# Patient Record
Sex: Female | Born: 2000
Health system: Southern US, Community
[De-identification: ages and names within clinical notes are randomized; demographics above are authoritative.]

## PROBLEM LIST (undated history)

## (undated) DIAGNOSIS — E079 Disorder of thyroid, unspecified: Secondary | ICD-10-CM

## (undated) DIAGNOSIS — E119 Type 2 diabetes mellitus without complications: Secondary | ICD-10-CM

## (undated) HISTORY — DX: Type 2 diabetes mellitus without complications: E11.9

## (undated) HISTORY — PX: APPENDECTOMY: SHX54

## (undated) HISTORY — DX: Disorder of thyroid, unspecified: E07.9

---
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
## 2019-02-14 ENCOUNTER — Other Ambulatory Visit: Payer: Self-pay

## 2019-02-14 ENCOUNTER — Ambulatory Visit (INDEPENDENT_AMBULATORY_CARE_PROVIDER_SITE_OTHER): Payer: BC Managed Care – PPO | Admitting: Family

## 2019-02-14 ENCOUNTER — Encounter (INDEPENDENT_AMBULATORY_CARE_PROVIDER_SITE_OTHER): Payer: Self-pay | Admitting: Family

## 2019-02-14 VITALS — BP 108/70 | HR 96 | Ht 63.15 in | Wt 164.2 lb

## 2019-02-14 DIAGNOSIS — E1065 Type 1 diabetes mellitus with hyperglycemia: Secondary | ICD-10-CM

## 2019-02-14 DIAGNOSIS — R7309 Other abnormal glucose: Secondary | ICD-10-CM

## 2019-02-14 DIAGNOSIS — IMO0001 Reserved for inherently not codable concepts without codable children: Secondary | ICD-10-CM

## 2019-02-14 DIAGNOSIS — E10649 Type 1 diabetes mellitus with hypoglycemia without coma: Secondary | ICD-10-CM | POA: Diagnosis not present

## 2019-02-14 DIAGNOSIS — R739 Hyperglycemia, unspecified: Secondary | ICD-10-CM

## 2019-02-14 LAB — POCT GLYCOSYLATED HEMOGLOBIN (HGB A1C): Hemoglobin A1C: 7.6 % — AB (ref 4.0–5.6)

## 2019-02-14 LAB — POCT GLUCOSE (DEVICE FOR HOME USE): Glucose Fasting, POC: 214 mg/dL — AB (ref 70–99)

## 2019-02-14 NOTE — Patient Instructions (Signed)
-  Always have fast sugar with you in case of low blood sugar (glucose tabs, regular juice or soda, candy) -Always wear your ID that states you have diabetes -Always bring your meter to your visit -Call/Email if you want to review blood sugars   - Upgrade to control IQ   - Look at getting [place] to put on before dexcom.

## 2019-02-14 NOTE — Progress Notes (Signed)
Pediatric Endocrinology Diabetes Consultation Follow-up Visit  [name] 2001/06/15 [id number]  Chief Complaint: Follow-up type 1 diabetes   Elizabeth Frees, MD   HPI: Elizabeth Stephenson  is a 18 y.o. female presenting for follow-up of type 1 diabetes. she is accompanied to this visit by her Mother.  83. [name] was diagnosed with type 1 diabetes when she was 18 years old. She believes it was the summer of 2012. Her older brother was diagnosed with type 1 diabetes at age 85. Mom recognized weight loss and other symptoms in Elizabeth Stephenson and tested her sugar at home- it was above 300 mg/dL. She called Dr. [name] at [facility] and started her on insulin at home. She was diagnosed with hypothyroidism around the same time. She is transferring care from her PCP office to pediatric endocrinology for management of these issues.    2. Since last visit to [facility] on 07/2018, she has been well.  No ER visits or hospitalizations.  She has been busy working about 40 hours per week. She is not going on any vacation this year. She is using Tslim insulin pump but has not started control IQ. She feels like she is getting more use to it and is ready to upgrade to Control IQ. Hypoglycemia is rare for her, she does not consistently feel when she is low. Starting to rotate sites to legs which is working well for her. s  Taking 75 mcg of levothyroxine per day. Denies missed doses. No fatigue, constipation or cold intolerance.     Insulin regimen: Tslim insulin pump  Basal Rates 12AM 0.95  4am 1.025  6am 0.975  8am 0.975  9pm 0.95    Insulin to Carbohydrate Ratio 12AM 10  6am 9              Insulin Sensitivity Factor 12AM 30                Target Blood Glucose 12AM 150  6am 120  9pm 150           Hypoglycemia: Not feeling lows until under 60 now. Does not feel lows at night. No glucagon needed.  Insulin pump and CGM download   - Avg Bg 185  - Using 61 units per day   - 66% bolus and 34%  basal  Med-alert ID: Not currently wearing. Injection sites: Abdomen (some scar tissue) and legs  Annual labs due: 05/2019 Ophthalmology due: 2019--> discussed importance today.     3. ROS: Greater than 10 systems reviewed with pertinent positives listed in HPI, otherwise neg. Constitutional: Sleeping well. Good energy and appetite. Weight stable.  Eyes: No changes in vision. No blurry vision. Wears glasses. Due for eye exam and discussed today.  Ears/Nose/Mouth/Throat: No difficulty swallowing. No neck pain  Cardiovascular: No palpitations.  Respiratory: No increased work of breathing. No SOB  Gastrointestinal: No constipation or diarrhea. No abdominal pain Genitourinary: No nocturia, no polyuria Musculoskeletal: No joint pain Neurologic: Normal sensation, no tremor Endocrine: No polydipsia.  No hyperpigmentation Psychiatric: Normal affect. No depression or anxiety.   Past Medical History:   Past Medical History:  Diagnosis Date  . Diabetes mellitus without complication ([place])   . Thyroid disease     Medications:  Outpatient Encounter Medications as of 02/14/2019  Medication Sig  . ACCU-CHEK FASTCLIX LANCETS MISC 1 each as directed by Does not apply route. Check sugar 6 x daily  . Continuous Blood Gluc Sensor (DEXCOM G6 SENSOR) MISC 1 kit by Does not apply  route daily as needed.  . Continuous Blood Gluc Transmit (DEXCOM G6 TRANSMITTER) MISC 1 kit by Does not apply route daily as needed.  Marland Kitchen glucagon 1 MG injection Follow package directions for low blood sugar.  Marland Kitchen glucose blood (ACCU-CHEK GUIDE) test strip Use as instructed for 6 checks per day plus per protocol for hyper/hypoglycemia  . insulin aspart (NOVOLOG FLEXPEN) 100 UNIT/ML FlexPen Inject up to 50 units daily in case of pump failure  . insulin aspart (NOVOLOG) 100 UNIT/ML injection UP TO 300 UNITS IN INSULIN PUMP EVERY 48-72 HOURS PER DKA AND HYPERGLYCEMIA PROTOCOLS  . levothyroxine (SYNTHROID) 75 MCG tablet TAKE 1 TABLET BY  MOUTH ONCE DAILY BEFORE BREAKFAST  . lidocaine-prilocaine (EMLA) cream Apply 1 application topically as needed.  . Insulin Glargine (LANTUS SOLOSTAR) 100 UNIT/ML Solostar Pen Up to 50 Units at bedtime and per care plan (Patient not taking: Reported on 02/14/2019)  . Insulin Pen Needle (INSUPEN PEN NEEDLES) 32G X 4 MM MISC BD Pen Needles- brand specific. Inject insulin via insulin pen 6 x daily (Patient not taking: Reported on 02/14/2019)   No facility-administered encounter medications on file as of 02/14/2019.     Allergies: No Known Allergies  Surgical History: No past surgical history on file.  Family History:  Family History  Problem Relation Age of Onset  . Hypertension Father   48 year old brother has Type 1 Diabetes.    Social History: Lives with: Mother and siblings. Stays with dad on weekends.  Looking for jobs. Wants to go to Cosmetology school   Physical Exam:  Vitals:   02/14/19 1129  BP: 108/70  Pulse: 96  Weight: 164 lb 3.2 oz (74.5 kg)  Height: 5' 3.15" (1.604 m)   BP 108/70   Pulse 96   Ht 5' 3.15" (1.604 m)   Wt 164 lb 3.2 oz (74.5 kg)   BMI 28.95 kg/m  Body mass index: body mass index is 28.95 kg/m. Blood pressure percentiles are not available for patients who are 18 years or older.  Ht Readings from Last 3 Encounters:  02/14/19 5' 3.15" (1.604 m) (33 %, Z= -0.44)*  08/09/18 5' 3.11" (1.603 m) (33 %, Z= -0.45)*  06/06/18 5' 3.39" (1.61 m) (37 %, Z= -0.33)*   * Growth percentiles are based on CDC (Girls, 2-20 Years) data.   Wt Readings from Last 3 Encounters:  02/14/19 164 lb 3.2 oz (74.5 kg) (90 %, Z= 1.30)*  08/09/18 161 lb 4.8 oz (73.2 kg) (90 %, Z= 1.27)*  06/06/18 160 lb 4 oz (72.7 kg) (90 %, Z= 1.26)*   * Growth percentiles are based on CDC (Girls, 2-20 Years) data.    Physical exam  General: Well developed, well nourished female in no acute distress.  Alert and oriented.  Head: Normocephalic, atraumatic.   Eyes:  Pupils equal and  round. EOMI.   Sclera white.  No eye drainage.   Ears/Nose/Mouth/Throat: Nares patent, no nasal drainage.  Normal dentition, mucous membranes moist.   Neck: supple, no cervical lymphadenopathy, no thyromegaly Cardiovascular: regular rate, normal S1/S2, no murmurs Respiratory: No increased work of breathing.  Lungs clear to auscultation bilaterally.  No wheezes. Abdomen: soft, nontender, nondistended. Normal bowel sounds.  No appreciable masses  Extremities: warm, well perfused, cap refill < 2 sec.   Musculoskeletal: Normal muscle mass.  Normal strength Skin: warm, dry.  No rash or lesions. Neurologic: alert and oriented, normal speech, no tremor    Labs: Last hemoglobin A1c: 7.7% on 07/2018  Results for orders placed or performed in visit on 02/14/19  POCT Glucose (Device for Home Use)  Result Value Ref Range   Glucose Fasting, POC 214 (A) 70 - 99 mg/dL   POC Glucose    POCT glycosylated hemoglobin (Hb A1C)  Result Value Ref Range   Hemoglobin A1C 7.6 (A) 4.0 - 5.6 %   HbA1c POC (<> result, manual entry)     HbA1c, POC (prediabetic range)     HbA1c, POC (controlled diabetic range)       Assessment/Plan: Whisper is a 18 y.o. female with type 1 diabetes in improving control on Tandem insulin pump and Dexcom CGM. She is doing well with diabetes care. Would benefit from upgrading insulin pump to control IQ. Her hemoglobin A1c is 7.6%.   1-3. DM w/o complication type I, uncontrolled (HCC)/Hyperglycemia/hypoglycemia unawareness  - Reviewed insulin pump and CGM download. Discussed trends and patterns.  - Rotate pump sites to prevent scar tissue.  - bolus 15 minutes prior to eating to limit blood sugar spikes.  - Reviewed carb counting and importance of accurate carb counting.  - Discussed signs and symptoms of hypoglycemia. Always have glucose available.  - POCT glucose and hemoglobin A1c  - Reviewed growth chart.    3. Hypothyroidism, acquired, autoimmune -  75 mcg of  levothyroxine per day  - Reviewed signs and symptoms of hypothyroid.   4. Lipophypertrophy - Rotate pump sites every 3 days. Rotate areas to prevent further scar tissue.   5. Insulin pump titration    No changes today.    Follow-up:   3 months.    I have spent >25 minutes with >50% of time in counseling, education and instruction. When a patient is on insulin, intensive monitoring of blood glucose levels is necessary to avoid hyperglycemia and hypoglycemia. Severe hyperglycemia/hypoglycemia can lead to hospital admissions and be life threatening.     Hermenia Bers,  FNP-C  Pediatric Specialist  [address] [place]  [place], [address]  Tele: [phone]

## 2019-04-04 ENCOUNTER — Other Ambulatory Visit (INDEPENDENT_AMBULATORY_CARE_PROVIDER_SITE_OTHER): Payer: Self-pay | Admitting: Family

## 2019-04-04 DIAGNOSIS — IMO0001 Reserved for inherently not codable concepts without codable children: Secondary | ICD-10-CM

## 2019-04-08 ENCOUNTER — Other Ambulatory Visit (INDEPENDENT_AMBULATORY_CARE_PROVIDER_SITE_OTHER): Payer: Self-pay | Admitting: Family

## 2019-04-08 DIAGNOSIS — IMO0001 Reserved for inherently not codable concepts without codable children: Secondary | ICD-10-CM

## 2019-05-11 DIAGNOSIS — Z635 Disruption of family by separation and divorce: Secondary | ICD-10-CM | POA: Diagnosis not present

## 2019-05-11 DIAGNOSIS — F4323 Adjustment disorder with mixed anxiety and depressed mood: Secondary | ICD-10-CM | POA: Diagnosis not present

## 2019-05-17 ENCOUNTER — Other Ambulatory Visit: Payer: Self-pay

## 2019-05-17 ENCOUNTER — Ambulatory Visit (INDEPENDENT_AMBULATORY_CARE_PROVIDER_SITE_OTHER): Payer: BC Managed Care – PPO | Admitting: Family

## 2019-05-17 ENCOUNTER — Encounter (INDEPENDENT_AMBULATORY_CARE_PROVIDER_SITE_OTHER): Payer: Self-pay | Admitting: Family

## 2019-05-17 VITALS — BP 114/62 | HR 76 | Wt 170.2 lb

## 2019-05-17 DIAGNOSIS — E108 Type 1 diabetes mellitus with unspecified complications: Secondary | ICD-10-CM

## 2019-05-17 DIAGNOSIS — E063 Autoimmune thyroiditis: Secondary | ICD-10-CM | POA: Diagnosis not present

## 2019-05-17 DIAGNOSIS — E10649 Type 1 diabetes mellitus with hypoglycemia without coma: Secondary | ICD-10-CM

## 2019-05-17 DIAGNOSIS — Z4681 Encounter for fitting and adjustment of insulin pump: Secondary | ICD-10-CM

## 2019-05-17 DIAGNOSIS — E65 Localized adiposity: Secondary | ICD-10-CM | POA: Diagnosis not present

## 2019-05-17 DIAGNOSIS — E1065 Type 1 diabetes mellitus with hyperglycemia: Secondary | ICD-10-CM

## 2019-05-17 DIAGNOSIS — IMO0002 Reserved for concepts with insufficient information to code with codable children: Secondary | ICD-10-CM

## 2019-05-17 DIAGNOSIS — R7309 Other abnormal glucose: Secondary | ICD-10-CM

## 2019-05-17 DIAGNOSIS — R739 Hyperglycemia, unspecified: Secondary | ICD-10-CM

## 2019-05-17 LAB — POCT URINALYSIS DIPSTICK
Glucose, UA: POSITIVE — AB
Ketones, UA: NEGATIVE

## 2019-05-17 LAB — POCT GLYCOSYLATED HEMOGLOBIN (HGB A1C): Hemoglobin A1C: 7.2 % — AB (ref 4.0–5.6)

## 2019-05-17 LAB — POCT GLUCOSE (DEVICE FOR HOME USE): POC Glucose: 406 mg/dL — AB (ref 70–99)

## 2019-05-17 NOTE — Progress Notes (Signed)
Pediatric Endocrinology Diabetes Consultation Follow-up Visit  Elizabeth Stephenson 07/25/2000 [id number]  Chief Complaint: Follow-up type 1 diabetes   Elizabeth Frees, MD   HPI: Elizabeth Stephenson  is a 18 y.o. female presenting for follow-up of type 1 diabetes. she is accompanied to this visit by her Mother.  30. [name] was diagnosed with type 1 diabetes when she was 18 years old. She believes it was the summer of 2012. Her older brother was diagnosed with type 1 diabetes at age 64. Mom recognized weight loss and other symptoms in Elizabeth Stephenson and tested her sugar at home- it was above 300 mg/dL. She called Dr. [name] at [facility] and started her on insulin at home. She was diagnosed with hypothyroidism around the same time. She is transferring care from her PCP office to pediatric endocrinology for management of these issues.    2. Since last visit to [facility] on 01/2019, she has been well.  No ER visits or hospitalizations.  She has been very busy working at [employer] working about 30 hours per week. She is doing exercise with a friend, mainly yoga, about 3 days per week. She feels like things with her diabetes care are going much better overall. Using Tslim insulin pump with Dexcom CGM, she is not using control IQ yet, it is working well. She is bolusing before she eats most of the time. Rarely having hypoglycemia, sometimes feels her lows.   She is trying ot stay away from her abdomen where her scar tissue is. She tends to have site failure when she uses front of her abdomen where her scar tissue.   Taking 75 mcg of levothyroxine per day. Denies missed doses. No fatigue, constipation or cold intolerance.     Insulin regimen: Tslim insulin pump  Basal Rates 12AM 0.95  4am 1.025  6am 0.975  8am 0.975  9pm 0.95    Insulin to Carbohydrate Ratio 12AM 10  6am 9              Insulin Sensitivity Factor 12AM 30                Target Blood Glucose 12AM 150  6am 120  9pm 150            Hypoglycemia: Not feeling lows until under 60 now. Does not feel lows at night. No glucagon needed.  Insulin pump and CGM download   - Avg Bg 172  - Target range; in target 56%, above target 41% and below target 2%   - Using 63 units per day   - 35% basal and 65% bolus  Med-alert ID: Not currently wearing. Injection sites: Abdomen (some scar tissue) and legs  Annual labs due: 05/2019 Ophthalmology due: 2019--> discussed importance today.     3. ROS: Greater than 10 systems reviewed with pertinent positives listed in HPI, otherwise neg. Constitutional: Sleeping well. Weight stable.   Eyes: No changes in vision. No blurry vision. Wears glasses. Due for eye exam and discussed today.  Ears/Nose/Mouth/Throat: No difficulty swallowing. No neck pain  Cardiovascular: No palpitations.  Respiratory: No increased work of breathing. No SOB  Gastrointestinal: No constipation or diarrhea. No abdominal pain Genitourinary: No nocturia, no polyuria Musculoskeletal: No joint pain Neurologic: Normal sensation, no tremor Endocrine: No polydipsia.  No hyperpigmentation Psychiatric: Normal affect. No depression or anxiety.   Past Medical History:   Past Medical History:  Diagnosis Date  . Diabetes mellitus without complication ([place])   . Thyroid disease  Medications:  Outpatient Encounter Medications as of 05/17/2019  Medication Sig  . ACCU-CHEK FASTCLIX LANCETS MISC 1 each as directed by Does not apply route. Check sugar 6 x daily  . Continuous Blood Gluc Sensor (DEXCOM G6 SENSOR) MISC USE EVERY DAY AS AS NEEDED  . Continuous Blood Gluc Transmit (DEXCOM G6 TRANSMITTER) MISC 1 kit by Does not apply route daily as needed.  . glucagon 1 MG injection Follow package directions for low blood sugar.  . glucose blood (ACCU-CHEK GUIDE) test strip Use as instructed for 6 checks per day plus per protocol for hyper/hypoglycemia  . insulin aspart (NOVOLOG FLEXPEN) 100 UNIT/ML FlexPen Inject up  to 50 units daily in case of pump failure  . insulin aspart (NOVOLOG) 100 UNIT/ML injection INJECT UP TO 300 UNITS IN INSULIN PUMP EVERY 48 HOURS  . Insulin Glargine (LANTUS SOLOSTAR) 100 UNIT/ML Solostar Pen Up to 50 Units at bedtime and per care plan  . Insulin Pen Needle (INSUPEN PEN NEEDLES) 32G X 4 MM MISC BD Pen Needles- brand specific. Inject insulin via insulin pen 6 x daily  . levothyroxine (SYNTHROID) 75 MCG tablet TAKE 1 TABLET BY MOUTH ONCE DAILY BEFORE BREAKFAST  . lidocaine-prilocaine (EMLA) cream Apply 1 application topically as needed.   No facility-administered encounter medications on file as of 05/17/2019.     Allergies: No Known Allergies  Surgical History: No past surgical history on file.  Family History:  Family History  Problem Relation Age of Onset  . Hypertension Father   19 year old brother has Type 1 Diabetes.    Social History: Lives with: Mother and siblings. Stays with dad on weekends.  Looking for jobs. Wants to go to Cosmetology school   Physical Exam:  Vitals:   05/17/19 1116  BP: 114/62  Pulse: 76  Weight: 170 lb 3.2 oz (77.2 kg)   BP 114/62   Pulse 76   Wt 170 lb 3.2 oz (77.2 kg)   BMI 30.01 kg/m  Body mass index: body mass index is 30.01 kg/m. Blood pressure percentiles are not available for patients who are 18 years or older.  Ht Readings from Last 3 Encounters:  02/14/19 5' 3.15" (1.604 m) (33 %, Z= -0.44)*  08/09/18 5' 3.11" (1.603 m) (33 %, Z= -0.45)*  06/06/18 5' 3.39" (1.61 m) (37 %, Z= -0.33)*   * Growth percentiles are based on CDC (Girls, 2-20 Years) data.   Wt Readings from Last 3 Encounters:  05/17/19 170 lb 3.2 oz (77.2 kg) (92 %, Z= 1.42)*  02/14/19 164 lb 3.2 oz (74.5 kg) (90 %, Z= 1.30)*  08/09/18 161 lb 4.8 oz (73.2 kg) (90 %, Z= 1.27)*   * Growth percentiles are based on CDC (Girls, 2-20 Years) data.    Physical exam  General: Well developed, well nourished female in no acute distress.  Alert and  oriented.  Head: Normocephalic, atraumatic.   Eyes:  Pupils equal and round. EOMI.   Sclera white.  No eye drainage.   Ears/Nose/Mouth/Throat: Nares patent, no nasal drainage.  Normal dentition, mucous membranes moist.   Neck: supple, no cervical lymphadenopathy, no thyromegaly Cardiovascular: regular rate, normal S1/S2, no murmurs Respiratory: No increased work of breathing.  Lungs clear to auscultation bilaterally.  No wheezes. Abdomen: soft, nontender, nondistended. Normal bowel sounds.  No appreciable masses  Extremities: warm, well perfused, cap refill < 2 sec.   Musculoskeletal: Normal muscle mass.  Normal strength Skin: warm, dry.  No rash or lesions. Neurologic: alert and oriented, normal   speech, no tremor    Labs: Last hemoglobin A1c: 7.6% on 01/2019  Results for orders placed or performed in visit on 05/17/19  POCT Glucose (Device for Home Use)  Result Value Ref Range   Glucose Fasting, POC     POC Glucose 406 (A) 70 - 99 mg/dl  POCT glycosylated hemoglobin (Hb A1C)  Result Value Ref Range   Hemoglobin A1C 7.2 (A) 4.0 - 5.6 %   HbA1c POC (<> result, manual entry)     HbA1c, POC (prediabetic range)     HbA1c, POC (controlled diabetic range)    POCT urinalysis dipstick  Result Value Ref Range   Color, UA     Clarity, UA     Glucose, UA Positive (A) Negative   Bilirubin, UA     Ketones, UA negative    Spec Grav, UA     Blood, UA     pH, UA     Protein, UA     Urobilinogen, UA     Nitrite, UA     Leukocytes, UA     Appearance     Odor       Assessment/Plan: Lesha is a 18 y.o. female with type 1 diabetes in improving control on Tandem insulin pump and Dexcom CGM. She is doing well with diabetes care and overall glucose control has improved. She needs mild basal increase. She would greatly benefit from upgrading pump to control IQ. Hemoglobin A1c is 7.2% today which is higher then ADA goal of <7%.   1-3. DM w/o complication type I, uncontrolled  (HCC)/Hyperglycemia/hypoglycemia unawareness  - Reviewed insulin pump and CGM download. Discussed trends and patterns.  - Rotate pump sites to prevent scar tissue.  - bolus 15 minutes prior to eating to limit blood sugar spikes.  - Reviewed carb counting and importance of accurate carb counting.  - Discussed signs and symptoms of hypoglycemia. Always have glucose available.  - POCT glucose and hemoglobin A1c  - Reviewed growth chart.  - Encouraged to upgrade to control IQ  - Lipid panel and Microalbumin ordered.   3. Hypothyroidism, acquired, autoimmune -  75 mcg of levothyroxine per day  - Reviewed signs and symptoms of hypothyroid.  - TSH, FT4 ordered   4. Lipophypertrophy - Rotate pump sites every 3 days. Rotate areas to prevent further scar tissue.   5. Insulin pump titration   Basal Rates 12AM 0.95  4am 1.025  6am 0.975  8am 0.975--> 1.02   9pm 0.95--> 1. 0      Follow-up:   3 months.   I have spent >40 minutes with >50% of time in counseling, education and instruction. When a patient is on insulin, intensive monitoring of blood glucose levels is necessary to avoid hyperglycemia and hypoglycemia. Severe hyperglycemia/hypoglycemia can lead to hospital admissions and be life threatening.     Hermenia Bers,  FNP-C  Pediatric Specialist  [address] [place]  [place], [address]  Tele: [phone]

## 2019-05-17 NOTE — Patient Instructions (Addendum)
-  Always have fast sugar with you in case of low blood sugar (glucose tabs, regular juice or soda, candy) -Always wear your ID that states you have diabetes -Always bring your meter to your visit -Call/Email if you want to review blood sugars  Basal Rates 12AM 0.95  4am 1.025  6am 0.975  8am 0.975--> 1.02   9pm 0.95--> 1. 0    Upgrade pump to control IQ

## 2019-05-18 LAB — MICROALBUMIN / CREATININE URINE RATIO
Creatinine, Urine: 29 mg/dL (ref 20–275)
Microalb Creat Ratio: 76 mcg/mg creat — ABNORMAL HIGH (ref <30)
Microalb, Ur: 2.2 mg/dL

## 2019-05-18 LAB — LIPID PANEL
Cholesterol: 153 mg/dL (ref <170)
HDL: 38 mg/dL — ABNORMAL LOW (ref >45)
LDL Cholesterol (Calc): 99 mg/dL (calc) (ref <110)
Non-HDL Cholesterol (Calc): 115 mg/dL (calc) (ref <120)
Total CHOL/HDL Ratio: 4 (calc) (ref <5.0)
Triglycerides: 72 mg/dL (ref <90)

## 2019-05-18 LAB — TSH: TSH: 2.05 mIU/L

## 2019-05-18 LAB — T4, FREE: Free T4: 1.2 ng/dL (ref 0.8–1.4)

## 2019-05-19 ENCOUNTER — Encounter (INDEPENDENT_AMBULATORY_CARE_PROVIDER_SITE_OTHER): Payer: Self-pay

## 2019-05-29 DIAGNOSIS — Z635 Disruption of family by separation and divorce: Secondary | ICD-10-CM | POA: Diagnosis not present

## 2019-05-29 DIAGNOSIS — F4323 Adjustment disorder with mixed anxiety and depressed mood: Secondary | ICD-10-CM | POA: Diagnosis not present

## 2019-06-12 DIAGNOSIS — Z635 Disruption of family by separation and divorce: Secondary | ICD-10-CM | POA: Diagnosis not present

## 2019-06-12 DIAGNOSIS — F4323 Adjustment disorder with mixed anxiety and depressed mood: Secondary | ICD-10-CM | POA: Diagnosis not present

## 2019-06-14 ENCOUNTER — Encounter (INDEPENDENT_AMBULATORY_CARE_PROVIDER_SITE_OTHER): Payer: Self-pay

## 2019-06-14 ENCOUNTER — Other Ambulatory Visit (INDEPENDENT_AMBULATORY_CARE_PROVIDER_SITE_OTHER): Payer: Self-pay | Admitting: *Deleted

## 2019-06-14 DIAGNOSIS — E063 Autoimmune thyroiditis: Secondary | ICD-10-CM

## 2019-06-14 MED ORDER — LEVOTHYROXINE SODIUM 75 MCG PO TABS: ORAL_TABLET | ORAL | 1 refills | Status: DC

## 2019-06-18 ENCOUNTER — Encounter (INDEPENDENT_AMBULATORY_CARE_PROVIDER_SITE_OTHER): Payer: Self-pay

## 2019-06-19 ENCOUNTER — Other Ambulatory Visit (INDEPENDENT_AMBULATORY_CARE_PROVIDER_SITE_OTHER): Payer: Self-pay | Admitting: *Deleted

## 2019-06-19 DIAGNOSIS — E109 Type 1 diabetes mellitus without complications: Secondary | ICD-10-CM

## 2019-06-19 MED ORDER — INSULIN ASPART 100 UNIT/ML ~~LOC~~ SOLN: SUBCUTANEOUS | 1 refills | Status: DC

## 2019-06-22 LAB — HM DIABETES EYE EXAM

## 2019-06-26 DIAGNOSIS — Z635 Disruption of family by separation and divorce: Secondary | ICD-10-CM | POA: Diagnosis not present

## 2019-06-26 DIAGNOSIS — F4323 Adjustment disorder with mixed anxiety and depressed mood: Secondary | ICD-10-CM | POA: Diagnosis not present

## 2019-06-29 ENCOUNTER — Encounter (HOSPITAL_COMMUNITY): Admission: EM | Disposition: A | Discharge status: Home / Self Care | Payer: Self-pay | Source: Home / Self Care

## 2019-06-29 ENCOUNTER — Emergency Department (HOSPITAL_COMMUNITY): Payer: BC Managed Care – PPO | Admitting: Anesthesiology

## 2019-06-29 ENCOUNTER — Inpatient Hospital Stay (HOSPITAL_COMMUNITY)
Admission: EM | Admit: 2019-06-29 | Discharge: 2019-07-02 | DRG: 340 | Disposition: A | Discharge status: Home / Self Care | Payer: BC Managed Care – PPO | Attending: General Surgery | Admitting: General Surgery

## 2019-06-29 ENCOUNTER — Encounter (HOSPITAL_COMMUNITY): Payer: Self-pay | Admitting: Emergency Medicine

## 2019-06-29 ENCOUNTER — Other Ambulatory Visit: Payer: Self-pay

## 2019-06-29 ENCOUNTER — Emergency Department (HOSPITAL_COMMUNITY): Payer: BC Managed Care – PPO

## 2019-06-29 ENCOUNTER — Telehealth (INDEPENDENT_AMBULATORY_CARE_PROVIDER_SITE_OTHER): Payer: Self-pay | Admitting: Family

## 2019-06-29 DIAGNOSIS — R1031 Right lower quadrant pain: Secondary | ICD-10-CM | POA: Diagnosis not present

## 2019-06-29 DIAGNOSIS — Z794 Long term (current) use of insulin: Secondary | ICD-10-CM | POA: Diagnosis not present

## 2019-06-29 DIAGNOSIS — K3532 Acute appendicitis with perforation and localized peritonitis, without abscess: Secondary | ICD-10-CM | POA: Diagnosis not present

## 2019-06-29 DIAGNOSIS — E1065 Type 1 diabetes mellitus with hyperglycemia: Secondary | ICD-10-CM | POA: Diagnosis present

## 2019-06-29 DIAGNOSIS — E119 Type 2 diabetes mellitus without complications: Secondary | ICD-10-CM | POA: Diagnosis not present

## 2019-06-29 DIAGNOSIS — E039 Hypothyroidism, unspecified: Secondary | ICD-10-CM | POA: Diagnosis not present

## 2019-06-29 DIAGNOSIS — D649 Anemia, unspecified: Secondary | ICD-10-CM | POA: Diagnosis not present

## 2019-06-29 DIAGNOSIS — E109 Type 1 diabetes mellitus without complications: Secondary | ICD-10-CM | POA: Diagnosis not present

## 2019-06-29 DIAGNOSIS — Z20828 Contact with and (suspected) exposure to other viral communicable diseases: Secondary | ICD-10-CM | POA: Diagnosis present

## 2019-06-29 DIAGNOSIS — R1110 Vomiting, unspecified: Secondary | ICD-10-CM | POA: Diagnosis not present

## 2019-06-29 DIAGNOSIS — R000 Tachycardia, unspecified: Secondary | ICD-10-CM | POA: Diagnosis not present

## 2019-06-29 DIAGNOSIS — Z9641 Presence of insulin pump (external) (internal): Secondary | ICD-10-CM

## 2019-06-29 DIAGNOSIS — D509 Iron deficiency anemia, unspecified: Secondary | ICD-10-CM | POA: Diagnosis not present

## 2019-06-29 DIAGNOSIS — R739 Hyperglycemia, unspecified: Secondary | ICD-10-CM | POA: Diagnosis present

## 2019-06-29 DIAGNOSIS — Z79890 Hormone replacement therapy (postmenopausal): Secondary | ICD-10-CM

## 2019-06-29 DIAGNOSIS — Z8249 Family history of ischemic heart disease and other diseases of the circulatory system: Secondary | ICD-10-CM

## 2019-06-29 DIAGNOSIS — E063 Autoimmune thyroiditis: Secondary | ICD-10-CM | POA: Diagnosis present

## 2019-06-29 DIAGNOSIS — E881 Lipodystrophy, not elsewhere classified: Secondary | ICD-10-CM | POA: Diagnosis not present

## 2019-06-29 DIAGNOSIS — K353 Acute appendicitis with localized peritonitis, without perforation or gangrene: Secondary | ICD-10-CM | POA: Diagnosis not present

## 2019-06-29 DIAGNOSIS — R110 Nausea: Secondary | ICD-10-CM

## 2019-06-29 DIAGNOSIS — R112 Nausea with vomiting, unspecified: Secondary | ICD-10-CM | POA: Diagnosis not present

## 2019-06-29 DIAGNOSIS — R1011 Right upper quadrant pain: Secondary | ICD-10-CM | POA: Diagnosis not present

## 2019-06-29 HISTORY — PX: LAPAROSCOPIC APPENDECTOMY: SHX408

## 2019-06-29 LAB — CBC
HCT: 31.9 % — ABNORMAL LOW (ref 36.0–46.0)
Hemoglobin: 9 g/dL — ABNORMAL LOW (ref 12.0–15.0)
MCH: 18.5 pg — ABNORMAL LOW (ref 26.0–34.0)
MCHC: 28.2 g/dL — ABNORMAL LOW (ref 30.0–36.0)
MCV: 65.6 fL — ABNORMAL LOW (ref 80.0–100.0)
Platelets: 444 10*3/uL — ABNORMAL HIGH (ref 150–400)
RBC: 4.86 MIL/uL (ref 3.87–5.11)
RDW: 16.1 % — ABNORMAL HIGH (ref 11.5–15.5)
WBC: 13.7 10*3/uL — ABNORMAL HIGH (ref 4.0–10.5)
nRBC: 0 % (ref 0.0–0.2)

## 2019-06-29 LAB — COMPREHENSIVE METABOLIC PANEL
ALT: 14 U/L (ref 0–44)
AST: 16 U/L (ref 15–41)
Albumin: 3.5 g/dL (ref 3.5–5.0)
Alkaline Phosphatase: 82 U/L (ref 38–126)
Anion gap: 13 (ref 5–15)
BUN: 9 mg/dL (ref 6–20)
CO2: 21 mmol/L — ABNORMAL LOW (ref 22–32)
Calcium: 8.8 mg/dL — ABNORMAL LOW (ref 8.9–10.3)
Chloride: 102 mmol/L (ref 98–111)
Creatinine, Ser: 0.52 mg/dL (ref 0.44–1.00)
GFR calc Af Amer: >60 mL/min (ref >60)
GFR calc non Af Amer: >60 mL/min (ref >60)
Glucose, Bld: 232 mg/dL — ABNORMAL HIGH (ref 70–99)
Potassium: 3.4 mmol/L — ABNORMAL LOW (ref 3.5–5.1)
Sodium: 136 mmol/L (ref 135–145)
Total Bilirubin: 0.3 mg/dL (ref 0.3–1.2)
Total Protein: 7.2 g/dL (ref 6.5–8.1)

## 2019-06-29 LAB — URINALYSIS, ROUTINE W REFLEX MICROSCOPIC
Bilirubin Urine: NEGATIVE
Glucose, UA: 150 mg/dL — AB
Hgb urine dipstick: NEGATIVE
Ketones, ur: 20 mg/dL — AB
Nitrite: NEGATIVE
Protein, ur: 30 mg/dL — AB
Specific Gravity, Urine: 1.011 (ref 1.005–1.030)
pH: 6 (ref 5.0–8.0)

## 2019-06-29 LAB — I-STAT BETA HCG BLOOD, ED (MC, WL, AP ONLY): I-stat hCG, quantitative: <5 m[IU]/mL (ref <5)

## 2019-06-29 LAB — RESPIRATORY PANEL BY RT PCR (FLU A&B, COVID)
Influenza A by PCR: NEGATIVE
Influenza B by PCR: NEGATIVE
SARS Coronavirus 2 by RT PCR: NEGATIVE

## 2019-06-29 LAB — HEMOGLOBIN A1C
Hgb A1c MFr Bld: 8 % — ABNORMAL HIGH (ref 4.8–5.6)
Mean Plasma Glucose: 182.9 mg/dL

## 2019-06-29 LAB — GLUCOSE, CAPILLARY: Glucose-Capillary: 150 mg/dL — ABNORMAL HIGH (ref 70–99)

## 2019-06-29 LAB — LIPASE, BLOOD: Lipase: 19 U/L (ref 11–51)

## 2019-06-29 SURGERY — APPENDECTOMY, LAPAROSCOPIC
Anesthesia: General

## 2019-06-29 MED ORDER — PROCHLORPERAZINE EDISYLATE 10 MG/2ML IJ SOLN: 5.0000 mg | INTRAMUSCULAR | Status: DC | PRN

## 2019-06-29 MED ORDER — FLUCONAZOLE 200 MG PO TABS
200.0000 mg | ORAL_TABLET | Freq: Once | ORAL | Status: AC
  Administered 2019-06-29: 200 mg via ORAL
  Filled 2019-06-29: qty 1

## 2019-06-29 MED ORDER — SODIUM CHLORIDE 0.9 % IV SOLN: Freq: Three times a day (TID) | INTRAVENOUS | Status: DC | PRN

## 2019-06-29 MED ORDER — SODIUM CHLORIDE 0.9 % IV SOLN
1000.0000 mL | Freq: Once | INTRAVENOUS | Status: AC
  Administered 2019-06-29: 1000 mL via INTRAVENOUS

## 2019-06-29 MED ORDER — ACETAMINOPHEN 650 MG RE SUPP: 650.0000 mg | Freq: Four times a day (QID) | RECTAL | Status: DC | PRN

## 2019-06-29 MED ORDER — ONDANSETRON HCL 4 MG/2ML IJ SOLN: 4.0000 mg | Freq: Four times a day (QID) | INTRAMUSCULAR | Status: DC | PRN

## 2019-06-29 MED ORDER — HYDROMORPHONE HCL 1 MG/ML IJ SOLN: 0.5000 mg | INTRAMUSCULAR | Status: DC | PRN

## 2019-06-29 MED ORDER — INSULIN ASPART 100 UNIT/ML ~~LOC~~ SOLN
0.0000 [IU] | SUBCUTANEOUS | Status: DC
  Administered 2019-06-30: 3 [IU] via SUBCUTANEOUS
  Administered 2019-06-30: 5 [IU] via SUBCUTANEOUS
  Administered 2019-06-30: 8 [IU] via SUBCUTANEOUS
  Administered 2019-06-30: 3 [IU] via SUBCUTANEOUS
  Filled 2019-06-29: qty 0.15

## 2019-06-29 MED ORDER — FENTANYL CITRATE (PF) 100 MCG/2ML IJ SOLN
25.0000 ug | INTRAMUSCULAR | Status: DC | PRN
  Administered 2019-06-29 (×5): 50 ug via INTRAVENOUS

## 2019-06-29 MED ORDER — BUPIVACAINE LIPOSOME 1.3 % IJ SUSP
INTRAMUSCULAR | Status: DC | PRN
  Administered 2019-06-29: 20 mL

## 2019-06-29 MED ORDER — PROPOFOL 10 MG/ML IV BOLUS
INTRAVENOUS | Status: AC
  Filled 2019-06-29: qty 20

## 2019-06-29 MED ORDER — MAGIC MOUTHWASH
15.0000 mL | Freq: Four times a day (QID) | ORAL | Status: DC | PRN
  Filled 2019-06-29: qty 15

## 2019-06-29 MED ORDER — METHOCARBAMOL 1000 MG/10ML IJ SOLN
1000.0000 mg | Freq: Four times a day (QID) | INTRAVENOUS | Status: DC | PRN
  Filled 2019-06-29: qty 10

## 2019-06-29 MED ORDER — LACTATED RINGERS IV SOLN: INTRAVENOUS | Status: DC

## 2019-06-29 MED ORDER — 0.9 % SODIUM CHLORIDE (POUR BTL) OPTIME
TOPICAL | Status: DC | PRN
  Administered 2019-06-29: 1000 mL

## 2019-06-29 MED ORDER — MENTHOL 3 MG MT LOZG: 1.0000 | LOZENGE | OROMUCOSAL | Status: DC | PRN

## 2019-06-29 MED ORDER — BUPIVACAINE LIPOSOME 1.3 % IJ SUSP
20.0000 mL | Freq: Once | INTRAMUSCULAR | Status: DC
  Filled 2019-06-29: qty 20

## 2019-06-29 MED ORDER — LIDOCAINE-PRILOCAINE 2.5-2.5 % EX CREA: 1.0000 "application " | TOPICAL_CREAM | CUTANEOUS | Status: DC | PRN

## 2019-06-29 MED ORDER — PIPERACILLIN-TAZOBACTAM 3.375 G IVPB
3.3750 g | Freq: Three times a day (TID) | INTRAVENOUS | Status: DC
  Administered 2019-06-29 – 2019-07-02 (×8): 3.375 g via INTRAVENOUS
  Filled 2019-06-29 (×10): qty 50

## 2019-06-29 MED ORDER — DIPHENHYDRAMINE HCL 12.5 MG/5ML PO ELIX: 12.5000 mg | ORAL_SOLUTION | Freq: Four times a day (QID) | ORAL | Status: DC | PRN

## 2019-06-29 MED ORDER — IOHEXOL 300 MG/ML  SOLN
100.0000 mL | Freq: Once | INTRAMUSCULAR | Status: AC | PRN
  Administered 2019-06-29: 100 mL via INTRAVENOUS

## 2019-06-29 MED ORDER — PHENOL 1.4 % MT LIQD: 1.0000 | OROMUCOSAL | Status: DC | PRN

## 2019-06-29 MED ORDER — MORPHINE SULFATE (PF) 4 MG/ML IV SOLN
4.0000 mg | Freq: Once | INTRAVENOUS | Status: AC
  Administered 2019-06-29: 4 mg via INTRAVENOUS
  Filled 2019-06-29: qty 1

## 2019-06-29 MED ORDER — HYDROCORTISONE 1 % EX CREA: 1.0000 "application " | TOPICAL_CREAM | Freq: Three times a day (TID) | CUTANEOUS | Status: DC | PRN

## 2019-06-29 MED ORDER — ACETAMINOPHEN 500 MG PO TABS
1000.0000 mg | ORAL_TABLET | Freq: Four times a day (QID) | ORAL | Status: DC
  Administered 2019-06-30 – 2019-07-01 (×7): 1000 mg via ORAL
  Filled 2019-06-29 (×8): qty 2

## 2019-06-29 MED ORDER — METRONIDAZOLE IN NACL 5-0.79 MG/ML-% IV SOLN
500.0000 mg | INTRAVENOUS | Status: AC
  Administered 2019-06-29: 500 mg via INTRAVENOUS
  Filled 2019-06-29: qty 100

## 2019-06-29 MED ORDER — CHLORHEXIDINE GLUCONATE CLOTH 2 % EX PADS: 6.0000 | MEDICATED_PAD | Freq: Once | CUTANEOUS | Status: DC

## 2019-06-29 MED ORDER — ALUM & MAG HYDROXIDE-SIMETH 200-200-20 MG/5ML PO SUSP: 30.0000 mL | Freq: Four times a day (QID) | ORAL | Status: DC | PRN

## 2019-06-29 MED ORDER — LIP MEDEX EX OINT
1.0000 "application " | TOPICAL_OINTMENT | Freq: Two times a day (BID) | CUTANEOUS | Status: DC
  Administered 2019-06-30 – 2019-07-02 (×5): 1 via TOPICAL
  Filled 2019-06-29 (×2): qty 7

## 2019-06-29 MED ORDER — GABAPENTIN 100 MG PO CAPS
200.0000 mg | ORAL_CAPSULE | Freq: Three times a day (TID) | ORAL | Status: DC
  Administered 2019-06-30 – 2019-07-02 (×7): 200 mg via ORAL
  Filled 2019-06-29 (×7): qty 2

## 2019-06-29 MED ORDER — MIDAZOLAM HCL 5 MG/5ML IJ SOLN
INTRAMUSCULAR | Status: DC | PRN
  Administered 2019-06-29: 2 mg via INTRAVENOUS

## 2019-06-29 MED ORDER — GABAPENTIN 300 MG PO CAPS
300.0000 mg | ORAL_CAPSULE | ORAL | Status: AC
  Administered 2019-06-29: 300 mg via ORAL
  Filled 2019-06-29: qty 1

## 2019-06-29 MED ORDER — CHLORHEXIDINE GLUCONATE CLOTH 2 % EX PADS
6.0000 | MEDICATED_PAD | Freq: Once | CUTANEOUS | Status: AC
  Administered 2019-06-29: 6 via TOPICAL

## 2019-06-29 MED ORDER — BUPIVACAINE HCL (PF) 0.25 % IJ SOLN
INTRAMUSCULAR | Status: DC | PRN
  Administered 2019-06-29: 30 mL

## 2019-06-29 MED ORDER — HYDROMORPHONE HCL 2 MG/ML IJ SOLN
INTRAMUSCULAR | Status: AC
  Filled 2019-06-29: qty 1

## 2019-06-29 MED ORDER — PIPERACILLIN-TAZOBACTAM 3.375 G IVPB 30 MIN
3.3750 g | Freq: Once | INTRAVENOUS | Status: AC
  Administered 2019-06-29: 3.375 g via INTRAVENOUS
  Filled 2019-06-29: qty 50

## 2019-06-29 MED ORDER — LACTATED RINGERS IV SOLN
INTRAVENOUS | Status: AC
  Administered 2019-06-29: via INTRAVENOUS

## 2019-06-29 MED ORDER — LACTATED RINGERS IV SOLN
INTRAVENOUS | Status: DC | PRN
  Administered 2019-06-29 (×2): via INTRAVENOUS

## 2019-06-29 MED ORDER — SODIUM CHLORIDE 0.9 % IV SOLN: 2.0000 g | Freq: Two times a day (BID) | INTRAVENOUS | Status: DC

## 2019-06-29 MED ORDER — PROPOFOL 10 MG/ML IV BOLUS
INTRAVENOUS | Status: DC | PRN
  Administered 2019-06-29: 150 mg via INTRAVENOUS

## 2019-06-29 MED ORDER — ROCURONIUM BROMIDE 10 MG/ML (PF) SYRINGE
PREFILLED_SYRINGE | INTRAVENOUS | Status: DC | PRN
  Administered 2019-06-29: 30 mg via INTRAVENOUS
  Administered 2019-06-29: 10 mg via INTRAVENOUS
  Administered 2019-06-29: 30 mg via INTRAVENOUS

## 2019-06-29 MED ORDER — HYDROCORTISONE (PERIANAL) 2.5 % EX CREA: 1.0000 "application " | TOPICAL_CREAM | Freq: Four times a day (QID) | CUTANEOUS | Status: DC | PRN

## 2019-06-29 MED ORDER — SODIUM CHLORIDE (PF) 0.9 % IJ SOLN
INTRAMUSCULAR | Status: AC
  Administered 2019-06-29: 17:00:00
  Filled 2019-06-29: qty 50

## 2019-06-29 MED ORDER — ONDANSETRON HCL 4 MG/2ML IJ SOLN
INTRAMUSCULAR | Status: DC | PRN
  Administered 2019-06-29: 4 mg via INTRAVENOUS

## 2019-06-29 MED ORDER — SODIUM CHLORIDE 0.9 % IV SOLN
2.0000 g | INTRAVENOUS | Status: AC
  Administered 2019-06-29: 2 g via INTRAVENOUS
  Filled 2019-06-29: qty 20

## 2019-06-29 MED ORDER — GUAIFENESIN-DM 100-10 MG/5ML PO SYRP: 10.0000 mL | ORAL_SOLUTION | ORAL | Status: DC | PRN

## 2019-06-29 MED ORDER — SUCCINYLCHOLINE CHLORIDE 200 MG/10ML IV SOSY
PREFILLED_SYRINGE | INTRAVENOUS | Status: DC | PRN
  Administered 2019-06-29: 140 mg via INTRAVENOUS

## 2019-06-29 MED ORDER — BUPIVACAINE HCL (PF) 0.25 % IJ SOLN
INTRAMUSCULAR | Status: AC
  Filled 2019-06-29: qty 30

## 2019-06-29 MED ORDER — ONDANSETRON HCL 4 MG/2ML IJ SOLN
4.0000 mg | Freq: Once | INTRAMUSCULAR | Status: AC
  Administered 2019-06-29: 4 mg via INTRAVENOUS
  Filled 2019-06-29: qty 2

## 2019-06-29 MED ORDER — LEVOTHYROXINE SODIUM 75 MCG PO TABS
75.0000 ug | ORAL_TABLET | Freq: Every day | ORAL | Status: DC
  Administered 2019-06-30 – 2019-07-02 (×3): 75 ug via ORAL
  Filled 2019-06-29 (×3): qty 1

## 2019-06-29 MED ORDER — LACTATED RINGERS IV BOLUS: 1000.0000 mL | Freq: Three times a day (TID) | INTRAVENOUS | Status: AC | PRN

## 2019-06-29 MED ORDER — TRAMADOL HCL 50 MG PO TABS
50.0000 mg | ORAL_TABLET | Freq: Four times a day (QID) | ORAL | Status: DC | PRN
  Administered 2019-06-30: 50 mg via ORAL
  Administered 2019-06-30: 100 mg via ORAL
  Filled 2019-06-29 (×2): qty 1
  Filled 2019-06-29: qty 2

## 2019-06-29 MED ORDER — MIDAZOLAM HCL 2 MG/2ML IJ SOLN
INTRAMUSCULAR | Status: AC
  Filled 2019-06-29: qty 2

## 2019-06-29 MED ORDER — KETAMINE HCL 10 MG/ML IJ SOLN
INTRAMUSCULAR | Status: AC
  Filled 2019-06-29: qty 1

## 2019-06-29 MED ORDER — ACETAMINOPHEN 325 MG PO TABS: 325.0000 mg | ORAL_TABLET | Freq: Four times a day (QID) | ORAL | Status: DC | PRN

## 2019-06-29 MED ORDER — METOPROLOL TARTRATE 5 MG/5ML IV SOLN: 5.0000 mg | Freq: Four times a day (QID) | INTRAVENOUS | Status: DC | PRN

## 2019-06-29 MED ORDER — DIPHENHYDRAMINE HCL 50 MG/ML IJ SOLN: 12.5000 mg | Freq: Four times a day (QID) | INTRAMUSCULAR | Status: DC | PRN

## 2019-06-29 MED ORDER — ALVIMOPAN 12 MG PO CAPS
12.0000 mg | ORAL_CAPSULE | Freq: Two times a day (BID) | ORAL | Status: DC
  Filled 2019-06-29: qty 1

## 2019-06-29 MED ORDER — LACTATED RINGERS IV BOLUS
1000.0000 mL | Freq: Once | INTRAVENOUS | Status: AC
  Administered 2019-06-29: 1000 mL via INTRAVENOUS

## 2019-06-29 MED ORDER — LIDOCAINE 2% (20 MG/ML) 5 ML SYRINGE
INTRAMUSCULAR | Status: DC | PRN
  Administered 2019-06-29: 40 mg via INTRAVENOUS
  Administered 2019-06-29 (×3): 10 mg via INTRAVENOUS

## 2019-06-29 MED ORDER — DEXAMETHASONE SODIUM PHOSPHATE 10 MG/ML IJ SOLN
INTRAMUSCULAR | Status: DC | PRN
  Administered 2019-06-29: 4 mg via INTRAVENOUS

## 2019-06-29 MED ORDER — SUGAMMADEX SODIUM 200 MG/2ML IV SOLN
INTRAVENOUS | Status: DC | PRN
  Administered 2019-06-29: 160 mg via INTRAVENOUS

## 2019-06-29 MED ORDER — FENTANYL CITRATE (PF) 250 MCG/5ML IJ SOLN
INTRAMUSCULAR | Status: AC
  Filled 2019-06-29: qty 5

## 2019-06-29 MED ORDER — HYDROMORPHONE HCL 1 MG/ML IJ SOLN
INTRAMUSCULAR | Status: DC | PRN
  Administered 2019-06-29: .5 mg via INTRAVENOUS
  Administered 2019-06-29: 1 mg via INTRAVENOUS

## 2019-06-29 MED ORDER — ENOXAPARIN SODIUM 40 MG/0.4ML ~~LOC~~ SOLN
40.0000 mg | Freq: Every day | SUBCUTANEOUS | Status: DC
  Administered 2019-06-30 – 2019-07-02 (×3): 40 mg via SUBCUTANEOUS
  Filled 2019-06-29 (×3): qty 0.4

## 2019-06-29 MED ORDER — CELECOXIB 200 MG PO CAPS
200.0000 mg | ORAL_CAPSULE | ORAL | Status: AC
  Administered 2019-06-29: 200 mg via ORAL
  Filled 2019-06-29: qty 1

## 2019-06-29 MED ORDER — SODIUM CHLORIDE 0.9 % IV SOLN
8.0000 mg | Freq: Four times a day (QID) | INTRAVENOUS | Status: DC | PRN
  Filled 2019-06-29: qty 4

## 2019-06-29 MED ORDER — ACETAMINOPHEN 500 MG PO TABS
1000.0000 mg | ORAL_TABLET | ORAL | Status: AC
  Administered 2019-06-29: 1000 mg via ORAL
  Filled 2019-06-29: qty 2

## 2019-06-29 MED ORDER — KETAMINE HCL 10 MG/ML IJ SOLN
INTRAMUSCULAR | Status: DC | PRN
  Administered 2019-06-29: 40 mg via INTRAVENOUS

## 2019-06-29 SURGICAL SUPPLY — 49 items
APPLIER CLIP 5 13 M/L LIGAMAX5 (MISCELLANEOUS)
APPLIER CLIP ROT 10 11.4 M/L (STAPLE)
CABLE HIGH FREQUENCY MONO STRZ (ELECTRODE) ×2 IMPLANT
CHLORAPREP W/TINT 26 (MISCELLANEOUS) ×2 IMPLANT
CLIP APPLIE 5 13 M/L LIGAMAX5 (MISCELLANEOUS) IMPLANT
CLIP APPLIE ROT 10 11.4 M/L (STAPLE) IMPLANT
COVER SURGICAL LIGHT HANDLE (MISCELLANEOUS) ×2 IMPLANT
COVER WAND RF STERILE (DRAPES) IMPLANT
CUTTER FLEX LINEAR 45M (STAPLE) IMPLANT
DECANTER SPIKE VIAL GLASS SM (MISCELLANEOUS) ×2 IMPLANT
DEVICE TROCAR PUNCTURE CLOSURE (ENDOMECHANICALS) IMPLANT
DRAPE LAPAROSCOPIC ABDOMINAL (DRAPES) ×2 IMPLANT
DRAPE WARM FLUID 44X44 (DRAPES) ×2 IMPLANT
DRSG TEGADERM 2-3/8X2-3/4 SM (GAUZE/BANDAGES/DRESSINGS) ×4 IMPLANT
DRSG TEGADERM 4X4.75 (GAUZE/BANDAGES/DRESSINGS) ×2 IMPLANT
ELECT REM PT RETURN 15FT ADLT (MISCELLANEOUS) ×2 IMPLANT
ENDOLOOP SUT PDS II  0 18 (SUTURE)
ENDOLOOP SUT PDS II 0 18 (SUTURE) IMPLANT
GAUZE SPONGE 2X2 8PLY STRL LF (GAUZE/BANDAGES/DRESSINGS) ×1 IMPLANT
GLOVE ECLIPSE 8.0 STRL XLNG CF (GLOVE) ×2 IMPLANT
GLOVE INDICATOR 8.0 STRL GRN (GLOVE) ×2 IMPLANT
GOWN STRL REUS W/TWL XL LVL3 (GOWN DISPOSABLE) ×4 IMPLANT
IRRIG SUCT STRYKERFLOW 2 WTIP (MISCELLANEOUS) ×2
IRRIGATION SUCT STRKRFLW 2 WTP (MISCELLANEOUS) ×1 IMPLANT
KIT BASIN OR (CUSTOM PROCEDURE TRAY) ×2 IMPLANT
KIT TURNOVER KIT A (KITS) IMPLANT
PAD POSITIONING PINK XL (MISCELLANEOUS) ×2 IMPLANT
PENCIL SMOKE EVACUATOR (MISCELLANEOUS) IMPLANT
POUCH RETRIEVAL ECOSAC 10 (ENDOMECHANICALS) ×1 IMPLANT
POUCH RETRIEVAL ECOSAC 10MM (ENDOMECHANICALS) ×1
RELOAD 45 VASCULAR/THIN (ENDOMECHANICALS) IMPLANT
RELOAD STAPLE TA45 3.5 REG BLU (ENDOMECHANICALS) IMPLANT
RELOAD STAPLER BLUE 60MM (STAPLE) ×1 IMPLANT
SCISSORS LAP 5X35 DISP (ENDOMECHANICALS) ×2 IMPLANT
SET TUBE SMOKE EVAC HIGH FLOW (TUBING) ×2 IMPLANT
SHEARS HARMONIC ACE PLUS 36CM (ENDOMECHANICALS) IMPLANT
SLEEVE XCEL OPT CAN 5 100 (ENDOMECHANICALS) ×2 IMPLANT
SPONGE GAUZE 2X2 STER 10/PKG (GAUZE/BANDAGES/DRESSINGS) ×1
STAPLE ECHEON FLEX 60 POW ENDO (STAPLE) ×2 IMPLANT
STAPLER RELOAD BLUE 60MM (STAPLE) ×2
SUT MNCRL AB 4-0 PS2 18 (SUTURE) ×2 IMPLANT
SUT PDS AB 0 CT1 36 (SUTURE) IMPLANT
SUT PDS AB 1 CT1 27 (SUTURE) IMPLANT
SUT SILK 2 0 SH (SUTURE) IMPLANT
TOWEL OR 17X26 10 PK STRL BLUE (TOWEL DISPOSABLE) ×2 IMPLANT
TRAY FOLEY MTR SLVR 16FR STAT (SET/KITS/TRAYS/PACK) IMPLANT
TRAY LAPAROSCOPIC (CUSTOM PROCEDURE TRAY) ×2 IMPLANT
TROCAR BLADELESS OPT 5 100 (ENDOMECHANICALS) ×2 IMPLANT
TROCAR XCEL 12X100 BLDLESS (ENDOMECHANICALS) ×2 IMPLANT

## 2019-06-29 NOTE — Transfer of Care (Signed)
Immediate Anesthesia Transfer of Care Note  Patient: Elizabeth Stephenson  Procedure(s) Performed: Procedure(s): APPENDECTOMY LAPAROSCOPIC (N/A)  Patient Location: PACU  Anesthesia Type:General  Level of Consciousness:  sedated, patient cooperative and responds to stimulation  Airway & Oxygen Therapy:Patient Spontanous Breathing and Patient connected to face mask oxgen  Post-op Assessment:  Report given to PACU RN and Post -op Vital signs reviewed and stable  Post vital signs:  Reviewed and stable  Last Vitals:  Vitals:   06/29/19 1800 06/29/19 1815  BP: (!) 110/58 (!) 109/56  Pulse: (!) 133 (!) 133  Resp: (!) 23 (!) 22  Temp:    SpO2: 55% 97%    Complications: No apparent anesthesia complications

## 2019-06-29 NOTE — ED Provider Notes (Signed)
Elizabeth Stephenson is a 18 y.o. female, presenting to the ED with RLQ pain beginning today.   HPI from Josephine Igo, MD: "Patient With a past medical history of Diabetes mellitus without complication ([place]) and Thyroid disease presents for sudden onset RLQ abdominal pain associated with nausea and vomiting. Patient is not had pain like this before. Denies fevers, chills, hematuria, dysuria, back pain, vaginal bleeding, vaginal discharge. Has not had any abdominal surgeries. Last menstrual period was in late November.  The history is provided by the patient.  Abdominal Pain  Pain location: RLQ Pain quality: aching  Pain radiates to: Does not radiate  Pain severity: Moderate  Onset quality: Sudden  Duration: 1 hour  Timing: Constant  Progression: Waxing and waning  Chronicity: New Context: not alcohol use, not awakening from sleep, not diet changes, not eating, not laxative use, not medication withdrawal, not previous surgeries, not recent illness, not recent sexual activity, not recent travel, not retching, not sick contacts, not suspicious food intake and not trauma  Relieved by: Nothing  Worsened by: Movement  Ineffective treatments: None tried Associated symptoms: diarrhea, nausea and vomiting  Associated symptoms: no chills, no constipation, no dysuria, no fever, no hematochezia, no hematuria, no vaginal bleeding and no vaginal discharge"   Past Medical History:  Diagnosis Date  . Diabetes mellitus without complication ([place])   . Thyroid disease     Physical Exam  BP 123/82   Pulse (!) 121   Temp (!) 97.5 F (36.4 C)   Resp (!) 21   LMP 06/22/2019   SpO2 100%   Physical Exam Vitals and nursing note reviewed.  Constitutional:      General: She is not in acute distress.    Appearance: She is well-developed. She is obese. She is ill-appearing. She is not diaphoretic.  HENT:     Head: Normocephalic and atraumatic.     Mouth/Throat:     Mouth: Mucous membranes are moist.      Pharynx: Oropharynx is clear.  Eyes:     Conjunctiva/sclera: Conjunctivae normal.  Cardiovascular:     Rate and Rhythm: Regular rhythm. Tachycardia present.     Pulses: Normal pulses.          Radial pulses are 2+ on the right side and 2+ on the left side.       Posterior tibial pulses are 2+ on the right side and 2+ on the left side.     Comments: Tactile temperature in the extremities appropriate and equal bilaterally. Pulmonary:     Effort: Pulmonary effort is normal. No respiratory distress.  Abdominal:     Palpations: Abdomen is soft.     Tenderness: There is no abdominal tenderness. There is no guarding.  Musculoskeletal:     Cervical back: Neck supple.     Right lower leg: No edema.     Left lower leg: No edema.  Lymphadenopathy:     Cervical: No cervical adenopathy.  Skin:    General: Skin is warm and dry.     Coloration: Skin is pale.  Neurological:     Mental Status: She is alert.  Psychiatric:        Mood and Affect: Mood and affect normal.        Speech: Speech normal.        Behavior: Behavior normal.     ED Course/Procedures     Procedures   Abnormal Labs Reviewed  COMPREHENSIVE METABOLIC PANEL - Abnormal; Notable for the following components:  Result Value   Potassium 3.4 (*)    CO2 21 (*)    Glucose, Bld 232 (*)    Calcium 8.8 (*)    All other components within normal limits  CBC - Abnormal; Notable for the following components:   WBC 13.7 (*)    Hemoglobin 9.0 (*)    HCT 31.9 (*)    MCV 65.6 (*)    MCH 18.5 (*)    MCHC 28.2 (*)    RDW 16.1 (*)    Platelets 444 (*)    All other components within normal limits  URINALYSIS, ROUTINE W REFLEX MICROSCOPIC - Abnormal; Notable for the following components:   APPearance HAZY (*)    Glucose, UA 150 (*)    Ketones, ur 20 (*)    Protein, ur 30 (*)    Leukocytes,Ua LARGE (*)    Bacteria, UA RARE (*)    All other components within normal limits   CT Abdomen Pelvis W Contrast  Result Date:  06/29/2019 CLINICAL DATA:  Right lower quadrant pain with nausea and vomiting EXAM: CT ABDOMEN AND PELVIS WITH CONTRAST TECHNIQUE: Multidetector CT imaging of the abdomen and pelvis was performed using the standard protocol following bolus administration of intravenous contrast. CONTRAST:  OMNIPAQUE IOHEXOL 300 MG/ML  SOLN COMPARISON:  None. FINDINGS: Lower chest: No acute abnormality. Hepatobiliary: No focal liver abnormality is seen. No gallstones, gallbladder wall thickening, or biliary dilatation. Pancreas: Unremarkable. No pancreatic ductal dilatation or surrounding inflammatory changes. Spleen: Normal in size without focal abnormality. Adrenals/Urinary Tract: Adrenal glands are within normal limits. Kidneys are well visualized bilaterally. No renal calculi or obstructive changes are seen. The ureters are within normal limits to the bladder. The bladder is partially distended. Stomach/Bowel: Fluid-filled loops of colon are noted without obstructive change. No small bowel obstructive changes are noted. Stomach is within normal limits. There are changes consistent with acute appendicitis. Appendix: Location: Infra cecal and medial Diameter: 16 mm Appendicolith: Absent Mucosal hyper-enhancement: Present Extraluminal gas: Small foci of extraluminal air is noted. Periappendiceal collection: Periappendiceal inflammatory changes are noted with a small amount of fluid just below the cecal tip although no demonstrable abscess is seen. Some reactive inflammatory changes of the terminal ileum are noted. Vascular/Lymphatic: No significant vascular findings are present. No enlarged abdominal or pelvic lymph nodes. Reproductive: Uterus and bilateral adnexa are unremarkable. Other: No hernia is seen. A moderate amount of free pelvic fluid is noted. Musculoskeletal: No acute or significant osseous findings. IMPRESSION: Changes consistent with acute appendicitis with evidence of free pelvic fluid as well as a small  amount of periappendiceal fluid and inflammatory change. A small focus of extraluminal air is noted as well likely related to micro perforation. Some reactive inflammatory changes in the distal ileum are seen as well. No other focal abnormality is noted. Critical Value/emergent results were called by telephone at the time of interpretation on 06/29/2019 at 4:33 pm to [facility], [place], who verbally acknowledged these results. Electronically Signed   By: Alcide Clever M.D.   On: 06/29/2019 16:34   MDM   Clinical Course as of Jun 28 1709  Thu Jun 29, 2019  1507 Comment 3:        [AT]  1509 Leukocytes,Ua(!): LARGE [AT]  1516 Hemoglobin(!): 9.0 [AT]  1632 Received call from radiologist for notification of abdominal CT abnormality. Due to the care of a critical patient in the ED, I took this call for Dr. Freida Busman. Radiologist informs me patient has acute appendicitis with  small perforation.   [SJ]  1638 I went in to speak with the patient and her mother at the bedside. Patient states her pain is well controlled.  She has been n.p.o. since last night.   [SJ]  1640 I discussed this with the patient and her mother.  They state patient has a history of anemia, however, they do not know previous levels. Mother states patient's skin coloring is consistent with her baseline and she does not appear to be any more pale than normal.  Hemoglobin(!): 9.0 [SJ]  1642 Blood glucose 137 noted on patient's insulin pump.   [SJ]    Clinical Course User Index [AT] Garnette Gunnerhompson, Aaron B, MD [SJ] Anselm PancoastJoy, Felisa Zechman C, PA-C   Patient presents with right lower quadrant pain beginning today.  She has evidence of appendicitis with likely perforation noted on CT. I was temporarily involved with this patient's care, but then turned care back over to Dr. Freida BusmanAllen.     Vitals:   06/29/19 1114 06/29/19 1527 06/29/19 1600  BP: 118/77 125/78 123/82  Pulse: (!) 114 (!) 116 (!) 121  Resp: 18 (!) 22 (!) 21  Temp: (!) 97.5 F (36.4 C)     SpO2: 100% 100% 100%       Concepcion LivingJoy, Vennesa Bastedo C, PA-C 06/29/19 1713    Lorre NickAllen, Anthony, MD 06/29/19 [id number]

## 2019-06-29 NOTE — Telephone Encounter (Signed)
Just fyi.

## 2019-06-29 NOTE — Consult Note (Addendum)
duplicate

## 2019-06-29 NOTE — Anesthesia Procedure Notes (Signed)
Performed by: Donni Oglesby M, CRNA       

## 2019-06-29 NOTE — ED Notes (Signed)
Electronic informed consent obtained. Mother at bedside, agreeable to take pts belongings with her.

## 2019-06-29 NOTE — H&P (Addendum)
Elizabeth Stephenson  09-Jul-2001 [id number]  CARE TEAM:  PCP: Shirline Frees, MD  Outpatient Care Team: Patient Care Team: Shirline Frees, MD as PCP - General (Family Medicine) Hermenia Bers, NP as Nurse Practitioner (Family Medicine)  Inpatient Treatment Team: Treatment Team: Attending Provider: Lacretia Leigh, MD; Resident: Bonnita Hollow, MD; Registered Nurse: Kelby Aline, RN; Technician: Luella Cook, EMT; Physician Assistant: Layla Maw; Consulting Physician: Edison Pace, Md, MD   This patient is a 18 y.o.female who presents today for surgical evaluation at the request of Dr Zenia Resides.   Chief complaint / Reason for evaluation: Appendicitis in Type 1 diabetic  Insulin requiring type I diabetic with insulin pump.  Had abdominal pain and nausea.  Worsened.  Vomiting.  Pain intensified.  Went to her family medicine clinic which helps manage her diabetes.  Pain in the right lower quadrant.  Concern for appendicitis.  CT scan recommended.  Sent to the emergency room.  CT scan concerning for appendicitis with gas and fluid suspect is very least microperforation.  Surgical consultation requested.  Patient obviously uncomfortable lying perfectly still.  Mother at bedside.  She denies prior surgeries.  She has an insulin pump.  Her glucoses have been running 70-90.  Last A1c was around 7 2 months ago.  Has had some changes in her urine and vaginal discharge.  No personal nor family history of GI/colon cancer, inflammatory bowel disease, irritable bowel syndrome, allergy such as Celiac Sprue, dietary/dairy problems, colitis, ulcers nor gastritis.  No recent sick contacts/gastroenteritis.  No travel outside the country.  No changes in diet.  No dysphagia to solids or liquids.  No significant heartburn or reflux.  No hematochezia, hematemesis, coffee ground emesis.  No evidence of prior gastric/peptic ulceration.  No recent sexual activity.  No alcohol intake.  No dietary changes.   Some loose bowel movements.  No bloody diarrhea   Assessment  Elizabeth Stephenson  18 y.o. female       Problem List:  Principal Problem:   Acute appendicitis with perforation and localized peritonitis Active Problems:   Type 1 diabetes mellitus on insulin therapy (HCC)   Hypothyroidism, acquired, autoimmune   Insulin pump in place   Plastic history and physical CT scan for perforated appendicitis and fluid in a type I insulin requiring diabetic.  Plan:  IV fluids.  IV antibiotics.  Diagnostic laparoscopy with appendectomy.  She is tachycardic and hyperglycemic already.  Symptoms less than 24 hours.  We will try and do it soon as possible.  Covid test pending.  Discussed with patient and mother at bedside.  They both agree with surgery soon as possible  The anatomy & physiology of the digestive tract was discussed.  The pathophysiology of appendicitis and other appendiceal disorders were discussed.  Natural history risks without surgery was discussed.   I feel the risks of no intervention will lead to serious problems that outweigh the operative risks; therefore, I recommended diagnostic laparoscopy with removal of appendix to remove the pathology.  Laparoscopic & open techniques were discussed.   I noted a good likelihood this will help address the problem.   Risks such as bleeding, infection, abscess, leak, reoperation, injury to other organs, need for repair of tissues / organs, possible ostomy, hernia, heart attack, stroke, death, and other risks were discussed.  Goals of post-operative recovery were discussed as well.  We will work to minimize complications.  Questions were answered.  The patient expresses understanding & wishes  to proceed with surgery.  We request medicine to help follow given her brittle type 1 diabetes.  We will do sliding scale insulin for now.  Check hemoglobin A1c.  Hypothyroidism.  Baseline levothyroxine for now  -VTE prophylaxis- SCDs, etc -mobilize as  tolerated to help recovery  30 minutes spent in review, evaluation, examination, counseling, and coordination of care.  More than 50% of that time was spent in counseling.  Adin Hector, MD, FACS, MASCRS Gastrointestinal and Minimally Invasive Surgery  [facility] [address] [place], [place] [address] [phone] Main / Paging [phone] Fax     06/29/2019      Past Medical History:  Diagnosis Date  . Diabetes mellitus without complication ([place])   . Thyroid disease     History reviewed. No pertinent surgical history.  Social History   Socioeconomic History  . Marital status: Single    Spouse name: Not on file  . Number of children: Not on file  . Years of education: Not on file  . Highest education level: Not on file  Occupational History  . Not on file  Tobacco Use  . Smoking status: Never Smoker  . Smokeless tobacco: Never Used  Substance and Sexual Activity  . Alcohol use: Not on file  . Drug use: Not on file  . Sexual activity: Not on file  Other Topics Concern  . Not on file  Social History Narrative   Lives at home with mom joint custody with father, sister, brother and other house with dad and brother.   Graduated May 18th! and is looking into cosmetology classes at [place] Strain:   . Difficulty of Paying Living Expenses: Not on file  Food Insecurity:   . Worried About Charity fundraiser in the Last Year: Not on file  . Ran Out of Food in the Last Year: Not on file  Transportation Needs:   . Lack of Transportation (Medical): Not on file  . Lack of Transportation (Non-Medical): Not on file  Physical Activity:   . Days of Exercise per Week: Not on file  . Minutes of Exercise per Session: Not on file  Stress:   . Feeling of Stress : Not on file  Social Connections:   . Frequency of Communication with Friends and Family: Not on file  . Frequency of  Social Gatherings with Friends and Family: Not on file  . Attends Religious Services: Not on file  . Active Member of Clubs or Organizations: Not on file  . Attends Archivist Meetings: Not on file  . Marital Status: Not on file  Intimate Partner Violence:   . Fear of Current or Ex-Partner: Not on file  . Emotionally Abused: Not on file  . Physically Abused: Not on file  . Sexually Abused: Not on file    Family History  Problem Relation Age of Onset  . Hypertension Father     No current facility-administered medications for this encounter.   Current Outpatient Medications  Medication Sig Dispense Refill  . glucagon 1 MG injection Follow package directions for low blood sugar. (Patient taking differently: Inject 1 mg into the muscle once as needed (low blood sugar). Follow package directions for low blood sugar) 2 each 1  . insulin aspart (NOVOLOG FLEXPEN) 100 UNIT/ML FlexPen Inject up to 50 units daily in case of pump failure (Patient taking differently: Inject 0-50 Units into the skin  as needed for high blood sugar (pump failure). Inject up to 50 units daily in case of pump failure) 15 mL 5  . insulin aspart (NOVOLOG) 100 UNIT/ML injection INJECT UP TO 300 UNITS IN INSULIN PUMP EVERY 48 HOURS (Patient taking differently: Inject 0-300 Units into the skin continuous. INJECT UP TO 300 UNITS IN INSULIN PUMP EVERY 48 HOURS) 120 mL 1  . Insulin Glargine (LANTUS SOLOSTAR) 100 UNIT/ML Solostar Pen Up to 50 Units at bedtime and per care plan (Patient taking differently: Inject 0-50 Units into the skin at bedtime as needed (high blood sugar). Up to 50 Units at bedtime and per care plan) 5 pen 12  . levothyroxine (SYNTHROID) 75 MCG tablet TAKE 1 TABLET BY MOUTH ONCE DAILY BEFORE BREAKFAST (Patient taking differently: Take 75 mcg by mouth daily before breakfast. ) 90 tablet 1  . ACCU-CHEK FASTCLIX LANCETS MISC 1 each as directed by Does not apply route. Check sugar 6 x daily 204 each 3  .  Continuous Blood Gluc Sensor (DEXCOM G6 SENSOR) MISC USE EVERY DAY AS AS NEEDED 3 each 5  . Continuous Blood Gluc Transmit (DEXCOM G6 TRANSMITTER) MISC 1 kit by Does not apply route daily as needed. 1 each 1  . glucose blood (ACCU-CHEK GUIDE) test strip Use as instructed for 6 checks per day plus per protocol for hyper/hypoglycemia 200 each 3  . Insulin Pen Needle (INSUPEN PEN NEEDLES) 32G X 4 MM MISC BD Pen Needles- brand specific. Inject insulin via insulin pen 6 x daily 200 each 3  . lidocaine-prilocaine (EMLA) cream Apply 1 application topically as needed. (Patient not taking: Reported on 06/29/2019) 30 g 4     No Known Allergies  ROS:   All other systems reviewed & are negative except per HPI or as noted below: Constitutional:  No fevers, sweats.  Weight stable Eyes:  No vision changes, No discharge HENT:  No sore throats, nasal drainage Lymph: No neck swelling, No bruising easily Pulmonary:  No cough, productive sputum CV: No orthopnea, PND  Patient walks 20 minutes for about 1 miles without difficulty.  No exertional chest/neck/shoulder/arm pain. GI:  No personal nor family history of GI/colon cancer, inflammatory bowel disease, irritable bowel syndrome, allergy such as Celiac Sprue, dietary/dairy problems, colitis, ulcers nor gastritis.  No recent sick contacts/gastroenteritis.  No travel outside the country.  No changes in diet. Renal: Urine more hazy, No hematuria Genital:  No drainage, bleeding, masses Musculoskeletal: No severe joint pain.  Good ROM major joints Skin:  No sores or lesions.  No rashes Heme/Lymph:  No easy bleeding.  No swollen lymph nodes Neuro: No focal weakness/numbness.  No seizures Psych: No suicidal ideation.  No hallucinations  BP 123/82   Pulse (!) 136   Temp (!) 97.5 F (36.4 C)   Resp 19   LMP 06/22/2019   SpO2 100%   Physical Exam: General: Pt awake/alert/oriented x4 in moderate acute distress.  Tired.  Somewhat sickly. Eyes: PERRL, normal EOM.  Sclera nonicteric Neuro: CN II-XII intact w/o focal sensory/motor deficits. Lymph: No head/neck/groin lymphadenopathy Psych:  No delerium/psychosis/paranoia HENT: Normocephalic, Mucus membranes moist.  No thrush Neck: Supple, No tracheal deviation Chest: No pain.  Good respiratory excursion. CV:  Pulses intact.  Regular rhythm.  Sinus tachycardia Abdomen: Overweight but soft.  Focal tenderness in right lower quadrant went involuntary guarding.  Tenderness over McBurney sign.  Upper abdomen and left lower quadrant not particularly tender.  No incisions.  No diastases or umbilical hernia.  Gen:  No inguinal hernias.  No inguinal lymphadenopathy.   Ext:  SCDs BLE.  No significant edema.  No cyanosis Skin: No petechiae / purpurea.  No major sores Musculoskeletal: No severe joint pain.  Good ROM major joints   Results:   Labs: Results for orders placed or performed during the hospital encounter of 06/29/19 (from the past 48 hour(s))  Lipase, blood     Status: None   Collection Time: 06/29/19 11:49 AM  Result Value Ref Range   Lipase 19 11 - 51 U/L    Comment: Performed at [facility], [place] [address], [place], [place] [address]  Comprehensive metabolic panel     Status: Abnormal   Collection Time: 06/29/19 11:49 AM  Result Value Ref Range   Sodium 136 135 - 145 mmol/L   Potassium 3.4 (L) 3.5 - 5.1 mmol/L   Chloride 102 98 - 111 mmol/L   CO2 21 (L) 22 - 32 mmol/L   Glucose, Bld 232 (H) 70 - 99 mg/dL   BUN 9 6 - 20 mg/dL   Creatinine, Ser 0.52 0.44 - 1.00 mg/dL   Calcium 8.8 (L) 8.9 - 10.3 mg/dL   Total Protein 7.2 6.5 - 8.1 g/dL   Albumin 3.5 3.5 - 5.0 g/dL   AST 16 15 - 41 U/L   ALT 14 0 - 44 U/L   Alkaline Phosphatase 82 38 - 126 U/L   Total Bilirubin 0.3 0.3 - 1.2 mg/dL   GFR calc non Af Amer >60 >60 mL/min   GFR calc Af Amer >60 >60 mL/min   Anion gap 13 5 - 15    Comment: Performed at [facility], [place] [address], [place], [place]  [address]  CBC     Status: Abnormal   Collection Time: 06/29/19 11:49 AM  Result Value Ref Range   WBC 13.7 (H) 4.0 - 10.5 K/uL   RBC 4.86 3.87 - 5.11 MIL/uL   Hemoglobin 9.0 (L) 12.0 - 15.0 g/dL   HCT 31.9 (L) 36.0 - 46.0 %   MCV 65.6 (L) 80.0 - 100.0 fL   MCH 18.5 (L) 26.0 - 34.0 pg   MCHC 28.2 (L) 30.0 - 36.0 g/dL   RDW 16.1 (H) 11.5 - 15.5 %   Platelets 444 (H) 150 - 400 K/uL   nRBC 0.0 0.0 - 0.2 %    Comment: Performed at [facility], [place] [address], [place], [place] [address]  I-Stat beta hCG blood, ED     Status: None   Collection Time: 06/29/19 11:54 AM  Result Value Ref Range   I-stat hCG, quantitative <5.0 <5 mIU/mL   Comment 3            Comment:   GEST. AGE      CONC.  (mIU/mL)   <=1 WEEK        5 - 50     2 WEEKS       50 - 500     3 WEEKS       100 - 10,000     4 WEEKS     1,000 - 30,000        FEMALE AND NON-PREGNANT FEMALE:     LESS THAN 5 mIU/mL   Urinalysis, Routine w reflex microscopic     Status: Abnormal   Collection Time: 06/29/19 12:04 PM  Result Value Ref Range   Color, Urine YELLOW YELLOW   APPearance HAZY (A) CLEAR   Specific Gravity,  Urine 1.011 1.005 - 1.030   pH 6.0 5.0 - 8.0   Glucose, UA 150 (A) NEGATIVE mg/dL   Hgb urine dipstick NEGATIVE NEGATIVE   Bilirubin Urine NEGATIVE NEGATIVE   Ketones, ur 20 (A) NEGATIVE mg/dL   Protein, ur 30 (A) NEGATIVE mg/dL   Nitrite NEGATIVE NEGATIVE   Leukocytes,Ua LARGE (A) NEGATIVE   RBC / HPF 0-5 0 - 5 RBC/hpf   WBC, UA 11-20 0 - 5 WBC/hpf   Bacteria, UA RARE (A) NONE SEEN   Squamous Epithelial / LPF 0-5 0 - 5   Mucus PRESENT     Comment: Performed at [facility], [place] [address], [place], [place] [address]    Imaging / Studies: CT Abdomen Pelvis W Contrast  Result Date: 06/29/2019 CLINICAL DATA:  Right lower quadrant pain with nausea and vomiting EXAM: CT ABDOMEN AND PELVIS WITH CONTRAST TECHNIQUE: Multidetector CT imaging of the abdomen and pelvis was performed  using the standard protocol following bolus administration of intravenous contrast. CONTRAST:  125m OMNIPAQUE IOHEXOL 300 MG/ML  SOLN COMPARISON:  None. FINDINGS: Lower chest: No acute abnormality. Hepatobiliary: No focal liver abnormality is seen. No gallstones, gallbladder wall thickening, or biliary dilatation. Pancreas: Unremarkable. No pancreatic ductal dilatation or surrounding inflammatory changes. Spleen: Normal in size without focal abnormality. Adrenals/Urinary Tract: Adrenal glands are within normal limits. Kidneys are well visualized bilaterally. No renal calculi or obstructive changes are seen. The ureters are within normal limits to the bladder. The bladder is partially distended. Stomach/Bowel: Fluid-filled loops of colon are noted without obstructive change. No small bowel obstructive changes are noted. Stomach is within normal limits. There are changes consistent with acute appendicitis. Appendix: Location: Infra cecal and medial Diameter: 16 mm Appendicolith: Absent Mucosal hyper-enhancement: Present Extraluminal gas: Small foci of extraluminal air is noted. Periappendiceal collection: Periappendiceal inflammatory changes are noted with a small amount of fluid just below the cecal tip although no demonstrable abscess is seen. Some reactive inflammatory changes of the terminal ileum are noted. Vascular/Lymphatic: No significant vascular findings are present. No enlarged abdominal or pelvic lymph nodes. Reproductive: Uterus and bilateral adnexa are unremarkable. Other: No hernia is seen. A moderate amount of free pelvic fluid is noted. Musculoskeletal: No acute or significant osseous findings. IMPRESSION: Changes consistent with acute appendicitis with evidence of free pelvic fluid as well as a small amount of periappendiceal fluid and inflammatory change. A small focus of extraluminal air is noted as well likely related to micro perforation. Some reactive inflammatory changes in the distal ileum  are seen as well. No other focal abnormality is noted. Critical Value/emergent results were called by telephone at the time of interpretation on 06/29/2019 at 4:33 pm to [facility] who verbally acknowledged these results. Electronically Signed   By: MInez CatalinaM.D.   On: 06/29/2019 16:34    Medications / Allergies: per chart  Antibiotics: Anti-infectives (From admission, onward)   Start     Dose/Rate Route Frequency Ordered Stop   06/29/19 1645  piperacillin-tazobactam (ZOSYN) IVPB 3.375 g     3.375 g 100 mL/hr over 30 Minutes Intravenous  Once 06/29/19 1641 06/29/19 1732        Note: Portions of this report may have been transcribed using voice recognition software. Every effort was made to ensure accuracy; however, inadvertent computerized transcription errors may be present.   Any transcriptional errors that result from this process are unintentional.    SAdin Hector MD, FACS, MASCRS Gastrointestinal and Minimally Invasive Surgery  [place] [address], [place], [place] [address] [phone] Main / Paging [phone] Fax     06/29/2019

## 2019-06-29 NOTE — ED Notes (Signed)
Family at bedside. 

## 2019-06-29 NOTE — Consult Note (Signed)
[facility] Medical Consultation  Elizabeth Stephenson CVE:[id number] DOB: [birth year] DOA: 06/29/2019 PCP: Shirline Frees, MD   Requesting physician: Dr. Johney Maine, general surgery Date of consultation: 06/29/2019 Reason for consultation: Diabetes management  Impression/Recommendations Principal Problem:   Acute appendicitis with perforation and localized peritonitis Active Problems:   Type 1 diabetes mellitus on insulin therapy ([place])   Hypothyroidism, acquired, autoimmune   Hyperglycemia   Insulin pump in place   Tachycardia   Acute gangrenous appendicitis with perforation and peritonitis   Acute appendicitis with perforation: Taken to the OR by general surgery for laparoscopy with appendectomy 06/29/2019.  Patient seen postop.  Perforated gangrene and purulent ascites noted per op note and will need 5 days antibiotic therapy.  Surgical pathology is obtained and pending.  Continue care per general surgery.  Hyperglycemia in setting of type 1 diabetes: Last A1c 7.2% on 05/17/2019.  Uses an insulin pump at home.  Hyperglycemia likely worsened in setting of acute appendicitis. -Agree with moderate SSI q4h for now while n.p.o. and in the perioperative setting -Can likely resume home insulin pump in the morning -Repeat A1c is 8.0%  Hypothyroidism: TSH 2.05 and free T4 1.2 on 05/17/2019. -Continue home Synthroid 75 mcg daily; if unable to take oral meds tomorrow can convert to IV Synthroid 37.4 mcg daily  TR[facility] will followup again tomorrow. Please contact us if we can be of assistance in the meanwhile. Thank you for this consultation.  Chief Complaint: Nausea, vomiting, right lower abdominal pain  HPI:  Elizabeth Stephenson is a 18 year old female with medical history significant for type 1 diabetes on insulin pump and hypothyroidism who presented to the ED for evaluation of worsening right lower quadrant abdominal pain with nausea and vomiting as advised by her primary care provider.   Patient seen in the PACU postoperatively.  She is somnolent but awakens easily.  Pain is improved.  In the ED, initial vitals showed BP 118/77, pulse 114, RR 18, temp 97.5 Fahrenheit, SPO2 100% on room air.  Labs are notable for WBC 13.7, hemoglobin 9.0, platelets 444,000, sodium 136, potassium 3.4, bicarb 21, anion gap 13, BUN 9, creatinine 0.52, serum glucose 232.  LFTs are within normal limits.  I-STAT beta-hCG is undetectable.  Urinalysis showed 20 ketones, negative nitrites, large leukocytes, 0-5 RBCs, 11-20 WBCs, rare bacteria on microscopy.  Urine culture was sent and pending.  SARS-CoV-2 PCR was negative.  Influenza a and B PCR tests were negative.  CT abdomen/pelvis with contrast showed acute appendicitis with free pelvic fluid and small amount of periappendiceal fluid.  Evidence of microperforation noted.  Patient was started on IV Zosyn.  General surgery were consulted for admission and plan for diagnostic laparoscopy and appendectomy tonight.  The hospitalist service was consulted to assist with diabetes management.  Review of Systems:  Full review of systems limited in the postop setting.  Past Medical History:  Diagnosis Date  . Thyroid disease   . Type 1 diabetes mellitus on insulin therapy ([place]) 05/26/2017   History reviewed. No pertinent surgical history. Social History:  reports that she has never smoked. She has never used smokeless tobacco. She reports that she does not drink alcohol or use drugs.  No Known Allergies Family History  Problem Relation Age of Onset  . Hypertension Father     Prior to Admission medications   Medication Sig Start Date End Date Taking? Authorizing Provider  glucagon 1 MG injection Follow package directions for low blood sugar. Patient taking differently: Inject 1 mg  into the muscle once as needed (low blood sugar). Follow package directions for low blood sugar 06/23/17  Yes Hermenia Bers, NP  insulin aspart (NOVOLOG FLEXPEN) 100  UNIT/ML FlexPen Inject up to 50 units daily in case of pump failure Patient taking differently: Inject 0-50 Units into the skin as needed for high blood sugar (pump failure). Inject up to 50 units daily in case of pump failure 11/08/18  Yes Hermenia Bers, NP  insulin aspart (NOVOLOG) 100 UNIT/ML injection INJECT UP TO 300 UNITS IN INSULIN PUMP EVERY 48 HOURS Patient taking differently: Inject 0-300 Units into the skin continuous. INJECT UP TO 300 UNITS IN INSULIN PUMP EVERY 48 HOURS 06/19/19  Yes Hermenia Bers, NP  Insulin Glargine (LANTUS SOLOSTAR) 100 UNIT/ML Solostar Pen Up to 50 Units at bedtime and per care plan Patient taking differently: Inject 0-50 Units into the skin at bedtime as needed (high blood sugar). Up to 50 Units at bedtime and per care plan 07/06/17  Yes Hermenia Bers, NP  levothyroxine (SYNTHROID) 75 MCG tablet TAKE 1 TABLET BY MOUTH ONCE DAILY BEFORE BREAKFAST Patient taking differently: Take 75 mcg by mouth daily before breakfast.  06/14/19  Yes Hermenia Bers, NP  ACCU-CHEK FASTCLIX LANCETS MISC 1 each as directed by Does not apply route. Check sugar 6 x daily 05/26/17   Lelon Huh, MD  Continuous Blood Gluc Sensor (DEXCOM G6 SENSOR) MISC USE EVERY DAY AS AS NEEDED 04/04/19   Hermenia Bers, NP  Continuous Blood Gluc Transmit (DEXCOM G6 TRANSMITTER) MISC 1 kit by Does not apply route daily as needed. 08/29/18   Hermenia Bers, NP  glucose blood (ACCU-CHEK GUIDE) test strip Use as instructed for 6 checks per day plus per protocol for hyper/hypoglycemia 05/26/17   Lelon Huh, MD  Insulin Pen Needle (INSUPEN PEN NEEDLES) 32G X 4 MM MISC BD Pen Needles- brand specific. Inject insulin via insulin pen 6 x daily 05/26/17   Lelon Huh, MD  lidocaine-prilocaine (EMLA) cream Apply 1 application topically as needed. Patient not taking: Reported on 06/29/2019 12/22/17   Hermenia Bers, NP   Physical Exam: Blood pressure (!) 109/56, pulse (!) 133, temperature 99.7  F (37.6 C), resp. rate (!) 22, height '5\' 3"'$  (1.6 m), weight 78 kg, last menstrual period 06/22/2019, SpO2 98 %. Vitals:   06/29/19 1815 06/29/19 2145  BP: (!) 109/56   Pulse: (!) 133   Resp: (!) 22   Temp:  99.7 F (37.6 C)  SpO2: 98%     Constitutional: Patient seen postop in the PACU, resting in bed with head elevated.  Somnolent but in no acute distress, appears comfortable. Eyes: PERRL, lids and conjunctivae normal ENMT: Mucous membranes are moist. Posterior pharynx clear of any exudate or lesions. Neck: normal, supple, no masses. Respiratory: clear to auscultation anteriorly, no wheezing, no crackles. Normal respiratory effort. No accessory muscle use.  Cardiovascular: Tachycardic, no murmurs / rubs / gallops. No extremity edema. 2+ pedal pulses. Abdomen: Postop laparoscopic wounds with dressings in place, bowel sounds diminished, nondistended Musculoskeletal: no clubbing / cyanosis. No joint deformity upper and lower extremities.  SCDs in place both legs. Skin: no rashes, lesions, ulcers. No induration Neurologic: Somnolent postoperatively with sedation still in effect, moving extremities spontaneously Psychiatric: Somnolent postoperatively, awakens easily  Labs on Admission:  Basic Metabolic Panel: Recent Labs  Lab 06/29/19 1149  NA 136  K 3.4*  CL 102  CO2 21*  GLUCOSE 232*  BUN 9  CREATININE 0.52  CALCIUM 8.8*   Liver Function Tests:  Recent Labs  Lab 06/29/19 1149  AST 16  ALT 14  ALKPHOS 82  BILITOT 0.3  PROT 7.2  ALBUMIN 3.5   Recent Labs  Lab 06/29/19 1149  LIPASE 19   No results for input(s): AMMONIA in the last 168 hours. CBC: Recent Labs  Lab 06/29/19 1149  WBC 13.7*  HGB 9.0*  HCT 31.9*  MCV 65.6*  PLT 444*   Cardiac Enzymes: No results for input(s): CKTOTAL, CKMB, CKMBINDEX, TROPONINI in the last 168 hours. BNP: Invalid input(s): POCBNP CBG: Recent Labs  Lab 06/29/19 2145  GLUCAP 150*    Radiological Exams on Admission: CT  Abdomen Pelvis W Contrast  Result Date: 06/29/2019 CLINICAL DATA:  Right lower quadrant pain with nausea and vomiting EXAM: CT ABDOMEN AND PELVIS WITH CONTRAST TECHNIQUE: Multidetector CT imaging of the abdomen and pelvis was performed using the standard protocol following bolus administration of intravenous contrast. CONTRAST:  14m OMNIPAQUE IOHEXOL 300 MG/ML  SOLN COMPARISON:  None. FINDINGS: Lower chest: No acute abnormality. Hepatobiliary: No focal liver abnormality is seen. No gallstones, gallbladder wall thickening, or biliary dilatation. Pancreas: Unremarkable. No pancreatic ductal dilatation or surrounding inflammatory changes. Spleen: Normal in size without focal abnormality. Adrenals/Urinary Tract: Adrenal glands are within normal limits. Kidneys are well visualized bilaterally. No renal calculi or obstructive changes are seen. The ureters are within normal limits to the bladder. The bladder is partially distended. Stomach/Bowel: Fluid-filled loops of colon are noted without obstructive change. No small bowel obstructive changes are noted. Stomach is within normal limits. There are changes consistent with acute appendicitis. Appendix: Location: Infra cecal and medial Diameter: 16 mm Appendicolith: Absent Mucosal hyper-enhancement: Present Extraluminal gas: Small foci of extraluminal air is noted. Periappendiceal collection: Periappendiceal inflammatory changes are noted with a small amount of fluid just below the cecal tip although no demonstrable abscess is seen. Some reactive inflammatory changes of the terminal ileum are noted. Vascular/Lymphatic: No significant vascular findings are present. No enlarged abdominal or pelvic lymph nodes. Reproductive: Uterus and bilateral adnexa are unremarkable. Other: No hernia is seen. A moderate amount of free pelvic fluid is noted. Musculoskeletal: No acute or significant osseous findings. IMPRESSION: Changes consistent with acute appendicitis with evidence of  free pelvic fluid as well as a small amount of periappendiceal fluid and inflammatory change. A small focus of extraluminal air is noted as well likely related to micro perforation. Some reactive inflammatory changes in the distal ileum are seen as well. No other focal abnormality is noted. Critical Value/emergent results were called by telephone at the time of interpretation on 06/29/2019 at 4:33 pm [facility] who verbally acknowledged these results. Electronically Signed   By: MInez CatalinaM.D.   On: 06/29/2019 16:34    EKG: Not performed.  VZada Finders MD [facility]   If 7PM-7AM, please contact night-coverage [email or web address] 06/29/2019, 10:01 PM

## 2019-06-29 NOTE — Progress Notes (Signed)
HR= 127. Patient was transferred to [place] from PACU. Alert and oriented x 4. Family at bedside.

## 2019-06-29 NOTE — ED Provider Notes (Signed)
Patient signed to me by Dr. Tyrone Nine and has evidence of appendicitis.  Patient started on IV antibiotics and will consult general surgery.   Lacretia Leigh, MD 06/29/19 [id number]

## 2019-06-29 NOTE — Discharge Instructions (Signed)
SURGERY: POST OP INSTRUCTIONS °(Surgery for small bowel obstruction, colon resection, etc) ° ° °###################################################################### ° °EAT °Gradually transition to a high fiber diet with a fiber supplement over the next few days after discharge ° °WALK °Walk an hour a day.  Control your pain to do that.   ° °CONTROL PAIN °Control pain so that you can walk, sleep, tolerate sneezing/coughing, go up/down stairs. ° °HAVE A BOWEL MOVEMENT DAILY °Keep your bowels regular to avoid problems.  OK to try a laxative to override constipation.  OK to use an antidairrheal to slow down diarrhea.  Call if not better after 2 tries ° °CALL IF YOU HAVE PROBLEMS/CONCERNS °Call if you are still struggling despite following these instructions. °Call if you have concerns not answered by these instructions ° °###################################################################### ° ° °DIET °Follow a light diet the first few days at home.  Start with a bland diet such as soups, liquids, starchy foods, low fat foods, etc.  If you feel full, bloated, or constipated, stay on a ful liquid or pureed/blenderized diet for a few days until you feel better and no longer constipated. °Be sure to drink plenty of fluids every day to avoid getting dehydrated (feeling dizzy, not urinating, etc.). °Gradually add a fiber supplement to your diet over the next week.  Gradually get back to a regular solid diet.  Avoid fast food or heavy meals the first week as you are more likely to get nauseated. °It is expected for your digestive tract to need a few months to get back to normal.  It is common for your bowel movements and stools to be irregular.  You will have occasional bloating and cramping that should eventually fade away.  Until you are eating solid food normally, off all pain medications, and back to regular activities; your bowels will not be normal. °Focus on eating a low-fat, high fiber diet the rest of your life  (See Getting to Good Bowel Health, below). ° °CARE of your INCISION or WOUND °It is good for closed incision and even open wounds to be washed every day.  Shower every day.  Short baths are fine.  Wash the incisions and wounds clean with soap & water.    °If you have a closed incision(s), wash the incision with soap & water every day.  You may leave closed incisions open to air if it is dry.   You may cover the incision with clean gauze & replace it after your daily shower for comfort. °If you have skin tapes (Steristrips) or skin glue (Dermabond) on your incision, leave them in place.  They will fall off on their own like a scab.  You may trim any edges that curl up with clean scissors.  If you have staples, set up an appointment for them to be removed in the office in 10 days after surgery.  °If you have a drain, wash around the skin exit site with soap & water and place a new dressing of gauze or band aid around the skin every day.  Keep the drain site clean & dry.    °If you have an open wound with packing, see wound care instructions.  In general, it is encouraged that you remove your dressing and packing, shower with soap & water, and replace your dressing once a day.  Pack the wound with clean gauze moistened with normal (0.9%) saline to keep the wound moist & uninfected.  Pressure on the dressing for 30 minutes will stop most wound   bleeding.  Eventually your body will heal & pull the open wound closed over the next few months.  °Raw open wounds will occasionally bleed or secrete yellow drainage until it heals closed.  Drain sites will drain a little until the drain is removed.  Even closed incisions can have mild bleeding or drainage the first few days until the skin edges scab over & seal.   °If you have an open wound with a wound vac, see wound vac care instructions. ° ° ° ° °ACTIVITIES as tolerated °Start light daily activities --- self-care, walking, climbing stairs-- beginning the day after surgery.   Gradually increase activities as tolerated.  Control your pain to be active.  Stop when you are tired.  Ideally, walk several times a day, eventually an hour a day.   °Most people are back to most day-to-day activities in a few weeks.  It takes 4-8 weeks to get back to unrestricted, intense activity. °If you can walk 30 minutes without difficulty, it is safe to try more intense activity such as jogging, treadmill, bicycling, low-impact aerobics, swimming, etc. °Save the most intensive and strenuous activity for last (Usually 4-8 weeks after surgery) such as sit-ups, heavy lifting, contact sports, etc.  Refrain from any intense heavy lifting or straining until you are off narcotics for pain control.  You will have off days, but things should improve week-by-week. °DO NOT PUSH THROUGH PAIN.  Let pain be your guide: If it hurts to do something, don't do it.  Pain is your body warning you to avoid that activity for another week until the pain goes down. °You may drive when you are no longer taking narcotic prescription pain medication, you can comfortably wear a seatbelt, and you can safely make sudden turns/stops to protect yourself without hesitating due to pain. °You may have sexual intercourse when it is comfortable. If it hurts to do something, stop. ° °MEDICATIONS °Take your usually prescribed home medications unless otherwise directed.   °Blood thinners:  °Usually you can restart any strong blood thinners after the second postoperative day.  It is OK to take aspirin right away.    ° If you are on strong blood thinners (warfarin/Coumadin, Plavix, Xerelto, Eliquis, Pradaxa, etc), discuss with your surgeon, medicine PCP, and/or cardiologist for instructions on when to restart the blood thinner & if blood monitoring is needed (PT/INR blood check, etc).   ° ° °PAIN CONTROL °Pain after surgery or related to activity is often due to strain/injury to muscle, tendon, nerves and/or incisions.  This pain is usually  short-term and will improve in a few months.  °To help speed the process of healing and to get back to regular activity more quickly, DO THE FOLLOWING THINGS TOGETHER: °1. Increase activity gradually.  DO NOT PUSH THROUGH PAIN °2. Use Ice and/or Heat °3. Try Gentle Massage and/or Stretching °4. Take over the counter pain medication °5. Take Narcotic prescription pain medication for more severe pain ° °Good pain control = faster recovery.  It is better to take more medicine to be more active than to stay in bed all day to avoid medications. °1.  Increase activity gradually °Avoid heavy lifting at first, then increase to lifting as tolerated over the next 6 weeks. °Do not “push through” the pain.  Listen to your body and avoid positions and maneuvers than reproduce the pain.  Wait a few days before trying something more intense °Walking an hour a day is encouraged to help your body recover faster   and more safely.  Start slowly and stop when getting sore.  If you can walk 30 minutes without stopping or pain, you can try more intense activity (running, jogging, aerobics, cycling, swimming, treadmill, sex, sports, weightlifting, etc.) °Remember: If it hurts to do it, then don’t do it! °2. Use Ice and/or Heat °You will have swelling and bruising around the incisions.  This will take several weeks to resolve. °Ice packs or heating pads (6-8 times a day, 30-60 minutes at a time) will help sooth soreness & bruising. °Some people prefer to use ice alone, heat alone, or alternate between ice & heat.  Experiment and see what works best for you.  Consider trying ice for the first few days to help decrease swelling and bruising; then, switch to heat to help relax sore spots and speed recovery. °Shower every day.  Short baths are fine.  It feels good!  Keep the incisions and wounds clean with soap & water.   °3. Try Gentle Massage and/or Stretching °Massage at the area of pain many times a day °Stop if you feel pain - do not  overdo it °4. Take over the counter pain medication °This helps the muscle and nerve tissues become less irritable and calm down faster °Choose ONE of the following over-the-counter anti-inflammatory medications: °Acetaminophen 500mg tabs (Tylenol) 1-2 pills with every meal and just before bedtime (avoid if you have liver problems or if you have acetaminophen in you narcotic prescription) °Naproxen 220mg tabs (ex. Aleve, Naprosyn) 1-2 pills twice a day (avoid if you have kidney, stomach, IBD, or bleeding problems) °Ibuprofen 200mg tabs (ex. Advil, Motrin) 3-4 pills with every meal and just before bedtime (avoid if you have kidney, stomach, IBD, or bleeding problems) °Take with food/snack several times a day as directed for at least 2 weeks to help keep pain / soreness down & more manageable. °5. Take Narcotic prescription pain medication for more severe pain °A prescription for strong pain control is often given to you upon discharge (for example: oxycodone/Percocet, hydrocodone/Norco/Vicodin, or tramadol/Ultram) °Take your pain medication as prescribed. °Be mindful that most narcotic prescriptions contain Tylenol (acetaminophen) as well - avoid taking too much Tylenol. °If you are having problems/concerns with the prescription medicine (does not control pain, nausea, vomiting, rash, itching, etc.), please call us [phone] to see if we need to switch you to a different pain medicine that will work better for you and/or control your side effects better. °If you need a refill on your pain medication, you must call the office before 4 pm and on weekdays only.  By federal law, prescriptions for narcotics cannot be called into a pharmacy.  They must be filled out on paper & picked up from our office by the patient or authorized caretaker.  Prescriptions cannot be filled after 4 pm nor on weekends.   ° °WHEN TO CALL US [phone] °Severe uncontrolled or worsening pain  °Fever over 101 F (38.5 C) °Concerns with  the incision: Worsening pain, redness, rash/hives, swelling, bleeding, or drainage °Reactions / problems with new medications (itching, rash, hives, nausea, etc.) °Nausea and/or vomiting °Difficulty urinating °Difficulty breathing °Worsening fatigue, dizziness, lightheadedness, blurred vision °Other concerns °If you are not getting better after two weeks or are noticing you are getting worse, contact our office [phone] for further advice.  We may need to adjust your medications, re-evaluate you in the office, send you to the emergency room, or see what other things we can do to help. °The   clinic staff is available to answer your questions during regular business hours (8:30am-5pm).  Please don’t hesitate to call and ask to speak to one of our nurses for clinical concerns.    °A surgeon from [facility] is always on call at the hospitals 24 hours/day °If you have a medical emergency, go to the nearest emergency room or call 911. ° °FOLLOW UP in our office °One the day of your discharge from the hospital (or the next business weekday), please call [facility] to set up or confirm an appointment to see your surgeon in the office for a follow-up appointment.  Usually it is 2-3 weeks after your surgery.   °If you have skin staples at your incision(s), let the office know so we can set up a time in the office for the nurse to remove them (usually around 10 days after surgery). °Make sure that you call for appointments the day of discharge (or the next business weekday) from the hospital to ensure a convenient appointment time. °IF YOU HAVE DISABILITY OR FAMILY LEAVE FORMS, BRING THEM TO THE OFFICE FOR PROCESSING.  DO NOT GIVE THEM TO YOUR DOCTOR. ° °[facility] °[address], [address], [place], [place]  [address] ? °[phone] - Main °[phone] - Toll Free,  [phone] - Fax °[email or web address] ° °GETTING TO GOOD BOWEL HEALTH. °It is  expected for your digestive tract to need a few months to get back to normal.  It is common for your bowel movements and stools to be irregular.  You will have occasional bloating and cramping that should eventually fade away.  Until you are eating solid food normally, off all pain medications, and back to regular activities; your bowels will not be normal.   °Avoiding constipation °The goal: ONE SOFT BOWEL MOVEMENT A DAY!    °Drink plenty of fluids.  Choose water first. °TAKE A FIBER SUPPLEMENT EVERY DAY THE REST OF YOUR LIFE °During your first week back home, gradually add back a fiber supplement every day °Experiment which form you can tolerate.   There are many forms such as powders, tablets, wafers, gummies, etc °Psyllium bran (Metamucil), methylcellulose (Citrucel), Miralax or Glycolax, Benefiber, Flax Seed.  °Adjust the dose week-by-week (1/2 dose/day to 6 doses a day) until you are moving your bowels 1-2 times a day.  Cut back the dose or try a different fiber product if it is giving you problems such as diarrhea or bloating. °Sometimes a laxative is needed to help jump-start bowels if constipated until the fiber supplement can help regulate your bowels.  If you are tolerating eating & you are farting, it is okay to try a gentle laxative such as double dose MiraLax, prune juice, or Milk of Magnesia.  Avoid using laxatives too often. °Stool softeners can sometimes help counteract the constipating effects of narcotic pain medicines.  It can also cause diarrhea, so avoid using for too long. °If you are still constipated despite taking fiber daily, eating solids, and a few doses of laxatives, call our office. °Controlling diarrhea °Try drinking liquids and eating bland foods for a few days to avoid stressing your intestines further. °Avoid dairy products (especially milk & ice cream) for a short time.  The intestines often can lose the ability to digest lactose when stressed. °Avoid foods that cause gassiness or  bloating.  Typical foods include beans and other legumes, cabbage, broccoli, and dairy foods.  Avoid greasy, spicy, fast foods.  Every person has   some sensitivity to other foods, so listen to your body and avoid those foods that trigger problems for you. °Probiotics (such as active yogurt, Align, etc) may help repopulate the intestines and colon with normal bacteria and calm down a sensitive digestive tract °Adding a fiber supplement gradually can help thicken stools by absorbing excess fluid and retrain the intestines to act more normally.  Slowly increase the dose over a few weeks.  Too much fiber too soon can backfire and cause cramping & bloating. °It is okay to try and slow down diarrhea with a few doses of antidiarrheal medicines.   °Bismuth subsalicylate (ex. Kayopectate, Pepto Bismol) for a few doses can help control diarrhea.  Avoid if pregnant.   °Loperamide (Imodium) can slow down diarrhea.  Start with one tablet (2mg) first.  Avoid if you are having fevers or severe pain.  °ILEOSTOMY PATIENTS WILL HAVE CHRONIC DIARRHEA since their colon is not in use.    °Drink plenty of liquids.  You will need to drink even more glasses of water/liquid a day to avoid getting dehydrated. °Record output from your ileostomy.  Expect to empty the bag every 3-4 hours at first.  Most people with a permanent ileostomy empty their bag 4-6 times at the least.   °Use antidiarrheal medicine (especially Imodium) several times a day to avoid getting dehydrated.  Start with a dose at bedtime & breakfast.  Adjust up or down as needed.  Increase antidiarrheal medications as directed to avoid emptying the bag more than 8 times a day (every 3 hours). °Work with your wound ostomy nurse to learn care for your ostomy.  See ostomy care instructions. °TROUBLESHOOTING IRREGULAR BOWELS °1) Start with a soft & bland diet. No spicy, greasy, or fried foods.  °2) Avoid gluten/wheat or dairy products from diet to see if symptoms improve. °3) Miralax  17gm or flax seed mixed in 8oz. water or juice-daily. May use 2-4 times a day as needed. °4) Gas-X, Phazyme, etc. as needed for gas & bloating.  °5) Prilosec (omeprazole) over-the-counter as needed °6)  Consider probiotics (Align, Activa, etc) to help calm the bowels down ° °Call your doctor if you are getting worse or not getting better.  Sometimes further testing (cultures, endoscopy, X-ray studies, CT scans, bloodwork, etc.) may be needed to help diagnose and treat the cause of the diarrhea. °[facility] °[address], [address], [place], [place]  [address] °[phone] - Main.    °[phone]  - Toll Free.   [phone] - Fax °[email or web address] ° ° ° ° °Appendicitis, Adult ° °Appendicitis is inflammation of the appendix. The appendix is a finger-shaped tube that is attached to the large intestine. If appendicitis is not treated, it can cause the appendix to tear (rupture). A ruptured appendix can lead to a life-threatening infection. It can also cause a painful collection of pus (abscess) to form in the appendix. °What are the causes? °This condition may be caused by a blockage in the appendix that leads to infection. The blockage can be caused by: °· A ball of stool (feces). °· Enlarged lymph glands. °In some cases, the cause may not be known. °What increases the risk? °Age is a risk factor. You are more likely to develop this condition if you are between 10 and 30 years of age. °What are the signs or symptoms? °Symptoms of this condition include: °· Pain that starts around the belly button and moves toward the lower right part of the abdomen.   The pain can become more severe as time passes. It gets worse with coughing or sudden movements. °· Tenderness in the lower right abdomen. °· Nausea. °· Vomiting. °· Loss of appetite. °· Fever. °· Difficulty passing stool (constipation). °· Passing very loose stools (diarrhea). °· Generally feeling unwell. °How is this  diagnosed? °This condition may be diagnosed with: °· A physical exam. °· Blood tests. °· Urine test. °To confirm the diagnosis, an ultrasound, MRI, or CT scan may be done. °How is this treated? °This condition is usually treated with surgery to remove the appendix (appendectomy). There are two methods for doing an appendectomy: °· Open appendectomy. In this surgery, the appendix is removed through a large incision that is made in the lower right abdomen. This procedure may be recommended if: °? You have major scarring from a previous surgery. °? You have a bleeding disorder. °? You are pregnant and are about to give birth. °? You have a condition that makes it hard to do surgery through small incisions (laparoscopic procedure). This includes severe infection or a ruptured appendix. °· Laparoscopic appendectomy. In this surgery, the appendix is removed through small incisions. This procedure usually causes less pain and fewer problems than an open appendectomy. It also has a shorter recovery time. °If the appendix has ruptured and an abscess has formed: °· A drain may be placed into the abscess to remove fluid. °· Antibiotic medicines may be given through an IV. °· The appendix may or may not need to be removed. °Follow these instructions at home: °If you had surgery, follow instructions from your health care provider about how to care for yourself at home and how to care for your incision. °Medicines °· Take over-the-counter and prescription medicines only as told by your health care provider. °· If you were prescribed an antibiotic medicine, take it as told by your health care provider. Do not stop taking the antibiotic even if you start to feel better. °Eating and drinking °· Follow instructions from your health care provider about eating restrictions. You may slowly resume a regular diet once your nausea or vomiting stops. °General instructions °· Do not use any products that contain nicotine or tobacco, such as  cigarettes, e-cigarettes, and chewing tobacco. If you need help quitting, ask your health care provider. °· Do not drive or use heavy machinery while taking prescription pain medicine. °· Ask your health care provider if the medicine prescribed to you can cause constipation. You may need to take steps to prevent or treat constipation, such as: °? Drink enough fluid to keep your urine pale yellow. °? Take over-the-counter or prescription medicines. °? Eat foods that are high in fiber, such as beans, whole grains, and fresh fruits and vegetables. °? Limit foods that are high in fat and processed sugars, such as fried or sweet foods. °· Keep all follow-up visits as told by your health care provider. This is important. °Contact a health care provider if: °· There is pus, blood, or excessive drainage coming from your incision. °· You have nausea or vomiting. °Get help right away if you have: °· Worsening abdominal pain. °· A fever. °· Chills. °· Fatigue. °· Muscle aches. °· Shortness of breath. °Summary °· Appendicitis is inflammation of the appendix. °· This condition may be caused by a blockage in the appendix that leads to infection. °· This condition is usually treated with surgery to remove the appendix. °This information is not intended to replace advice given to you by your health   care provider. Make sure you discuss any questions you have with your health care provider. °Document Released: 07/06/2005 Document Revised: 12/22/2017 Document Reviewed: 12/22/2017 °[employer] Patient Education © 2020 [employer] ° °

## 2019-06-29 NOTE — Op Note (Signed)
PATIENT:  Elizabeth Stephenson  18 y.o. female  Patient Care Team: [name], MD as PCP - General (Family Medicine) Gretchen Short, NP as Nurse Practitioner (Family Medicine)  PRE-OPERATIVE DIAGNOSIS:  ACUTE APPENDICITIS  POST-OPERATIVE DIAGNOSIS:  ACUTE PERFORATED APPENDICITIS WITH FOCAL GANGRENE & DIFFUSE PERITONITIS  PROCEDURE:   APPENDECTOMY LAPAROSCOPIC TAP BLOCK - BILATERAL  SURGEON:  Ardeth Sportsman, MD  ANESTHESIA:   local and general  EBL:  Total I/O In: 1200 [I.V.:1000; IV Piggyback:200] Out: 25 [Blood:25]  Delay start of Pharmacological VTE agent (>24hrs) due to surgical blood loss or risk of bleeding:  no  DRAINS: none   SPECIMEN:  APPENDIX  DISPOSITION OF SPECIMEN:  PATHOLOGY  COUNTS:  YES  PLAN OF CARE: Admit to inpatient   PATIENT DISPOSITION:  PACU - guarded condition.   INDICATIONS: Patient with concerning symptoms & work up suspicious for appendicitis.  Surgery was recommended:  The anatomy & physiology of the digestive tract was discussed.  The pathophysiology of appendicitis was discussed.  Natural history risks without surgery was discussed.   I feel the risks of no intervention will lead to serious problems that outweigh the operative risks; therefore, I recommended diagnostic laparoscopy with removal of appendix to remove the pathology.  Laparoscopic & open techniques were discussed.   I noted a good likelihood this will help address the problem.    Risks such as bleeding, infection, abscess, leak, reoperation, possible ostomy, hernia, heart attack, death, and other risks were discussed.  Goals of post-operative recovery were discussed as well.  We will work to minimize complications.  Questions were answered.  The patient expresses understanding & wishes to proceed with surgery.  OR FINDINGS: Thickened inflamed appendix with perforation near the base and pocket of gangrene.  Purulent ascites in all 4 quadrants with peritonitis.  No localized  abscess yet  DESCRIPTION:   The patient was identified & brought into the operating room. The patient was positioned supine with arms tucked. SCDs were active during the entire case. The patient underwent general anesthesia without any difficulty.  The abdomen was prepped and draped in a sterile fashion. A Surgical Timeout confirmed our plan.  I made a transverse incision through the superior umbilical fold.  I made a small transverse nick through the infraumbilical fascia and confirmed peritoneal entry.  I placed a 67mm port.  We induced carbon dioxide insufflation.  Camera inspection revealed no injury.  Purulent ascites noted in the right side of abdomen and left upper quadrant.  I placed additional ports under direct laparoscopic visualization.  I mobilized the terminal ileum to proximal ascending colon in a lateral to medial fashion.  I took care to avoid injuring any retroperitoneal structures.  Once this was rolled over showing a thickened blind end structure suspicious for an enlarged appendix.  I aspirated the purulent ascites in all quadrants in the pelvis.  I irrigated several liters to help clear that up.  I confirmed the ovaries and adnexa were noninflamed.  Although she had voided prior to coming to the operating room her bladder was still rather full.  I mobilized the right colon more to confirm that this was indeed the appendix.  There is a focal area of phlegmon and perforation with some fecaliths in the region.  Suspicious for early gangrene as well.  I freed the appendix off its attachments to the ascending colon and cecal mesentery.  I elevated the appendix. I transected through the mesoappendix and the fold of Treves on the  antimesenteric terminal ileum. I was able to free off the base of the appendix which had an anterior perforation with fecaliths.  I was able to skeletonize onto the cecal wall which was much softer and was still viable.  I stapled the appendix off the cecum using a  laparoscopic stapler. I took a healthy 6x4 cm cuff of viable cecum, sparing the ileocecal valve.  I placed the appendix inside an EndoCatch bag and removed out the 12 mm port.  I did copious irrigation in all 4 quadrants in the pelvis.  Total of 8 L was done.  This cleared up the purulent ascites and peritonitis much better.  Hemostasis was good in the mesoappendix, colon mesentery, and retroperitoneum. Staple line was intact on the cecum with no bleeding.  I ran the small bowel proximally from the ileocecal valve to the mid jejunum and confirmed no Meckel's diverticulum or interloop abscesses or any other abnormalities.  There is no evidence of any creeping fat or Crohn's.  Liver look fine.  Gallbladder only with some mild changes consistent with some peritonitis and no active cholecystitis.  Stomach not enlarged.  Colon otherwise seen rather underwhelming from the cecum to the rectosigmoid junction.  There was no iatrogenic perforation or injury.  Because the area cleaned up well after irrigation, I did not place a drain.   I closed the 12 mm stapler port site in the left suprapubic region with a 0 Vicryl stitch using a suture passer under direct laparoscopic visualization.   I aspirated the carbon dioxide. I removed the ports.  I closed skin using 4-0 monocryl stitch.  Sterile dressings applied.  Patient was extubated and sent to the recovery room.  I discussed the operative findings with the patient's mother, Elizabeth Stephenson.  I suspect the patient is going used in the hospital at least overnight and will need antibiotics for 5 days.  With the perforated gangrene and purulent ascites, she is an increased risk for abscess formation or postoperative ileus.  Questions answered. She expressed understanding and appreciation.  Adin Hector, M.D., F.A.C.S. Gastrointestinal and Minimally Invasive Surgery [facility] [address], [place], [place] [address] [phone]  Main / Paging  06/29/2019 9:24 PM

## 2019-06-29 NOTE — ED Triage Notes (Signed)
Per EMS-states right lower quadrant pain that started 40 minutes ago-nausea and vomiting

## 2019-06-29 NOTE — ED Provider Notes (Signed)
[facility]-EMERGENCY DEPT Provider Note   CSN: [id number] Arrival date & time: 06/29/19  1101     History Chief Complaint  Patient presents with  . Abdominal Pain    Elizabeth Stephenson is a 18 y.o. female.  Patient  With a past medical history of Diabetes mellitus without complication (HCC) and Thyroid disease presents for sudden onset  RLQ abdominal pain associated with nausea and vomiting.  Patient is not had pain like this before.  Denies fevers, chills, hematuria, dysuria, back pain, vaginal bleeding, vaginal discharge.  Has not had any abdominal surgeries.  Last menstrual period was in late November.   The history is provided by the patient.  Abdominal Pain Pain location:  RLQ Pain quality: aching   Pain radiates to:  Does not radiate Pain severity:  Moderate Onset quality:  Sudden Duration:  1 hour Timing:  Constant Progression:  Waxing and waning Chronicity:  New Context: not alcohol use, not awakening from sleep, not diet changes, not eating, not laxative use, not medication withdrawal, not previous surgeries, not recent illness, not recent sexual activity, not recent travel, not retching, not sick contacts, not suspicious food intake and not trauma   Relieved by:  Nothing Worsened by:  Movement Ineffective treatments:  None tried Associated symptoms: diarrhea, nausea and vomiting   Associated symptoms: no chills, no constipation, no dysuria, no fever, no hematochezia, no hematuria, no vaginal bleeding and no vaginal discharge        Past Medical History:  Diagnosis Date  . Diabetes mellitus without complication (HCC)   . Thyroid disease     Patient Active Problem List   Diagnosis Date Noted  . Hyperglycemia 01/26/2018  . Insulin pump titration 01/26/2018  . Lipohypertrophy 01/26/2018  . Adjustment reaction of adult life 12/16/2017  . Hypoglycemia unawareness in type 1 diabetes mellitus (HCC) 12/16/2017  . Elevated hemoglobin A1c  12/16/2017  . Maladaptive health behaviors affecting medical condition 06/23/2017  . DM w/o complication type I, uncontrolled 05/26/2017  . Hypothyroidism, acquired, autoimmune 05/26/2017    History reviewed. No pertinent surgical history.   OB History   No obstetric history on file.     Family History  Problem Relation Age of Onset  . Hypertension Father     Social History   Tobacco Use  . Smoking status: Never Smoker  . Smokeless tobacco: Never Used  Substance Use Topics  . Alcohol use: Not on file  . Drug use: Not on file    Home Medications Prior to Admission medications   Medication Sig Start Date End Date Taking? Authorizing Provider  glucagon 1 MG injection Follow package directions for low blood sugar. Patient taking differently: Inject 1 mg into the muscle once as needed (low blood sugar). Follow package directions for low blood sugar 06/23/17  Yes Beasley, Spenser, NP  insulin aspart (NOVOLOG FLEXPEN) 100 UNIT/ML FlexPen Inject up to 50 units daily in case of pump failure Patient taking differently: Inject 0-50 Units into the skin as needed for high blood sugar (pump failure). Inject up to 50 units daily in case of pump failure 11/08/18  Yes Beasley, Spenser, NP  insulin aspart (NOVOLOG) 100 UNIT/ML injection INJECT UP TO 300 UNITS IN INSULIN PUMP EVERY 48 HOURS Patient taking differently: Inject 0-300 Units into the skin continuous. INJECT UP TO 300 UNITS IN INSULIN PUMP EVERY 48 HOURS 06/19/19  Yes Beasley, Spenser, NP  Insulin Glargine (LANTUS SOLOSTAR) 100 UNIT/ML Solostar Pen Up to 50 Units at bedtime   and per care plan Patient taking differently: Inject 0-50 Units into the skin at bedtime as needed (high blood sugar). Up to 50 Units at bedtime and per care plan 07/06/17  Yes Hermenia Bers, NP  levothyroxine (SYNTHROID) 75 MCG tablet TAKE 1 TABLET BY MOUTH ONCE DAILY BEFORE BREAKFAST Patient taking differently: Take 75 mcg by mouth daily before breakfast.   06/14/19  Yes Hermenia Bers, NP  ACCU-CHEK FASTCLIX LANCETS MISC 1 each as directed by Does not apply route. Check sugar 6 x daily 05/26/17   Lelon Huh, MD  Continuous Blood Gluc Sensor (DEXCOM G6 SENSOR) MISC USE EVERY DAY AS AS NEEDED 04/04/19   Hermenia Bers, NP  Continuous Blood Gluc Transmit (DEXCOM G6 TRANSMITTER) MISC 1 kit by Does not apply route daily as needed. 08/29/18   Hermenia Bers, NP  glucose blood (ACCU-CHEK GUIDE) test strip Use as instructed for 6 checks per day plus per protocol for hyper/hypoglycemia 05/26/17   Lelon Huh, MD  Insulin Pen Needle (INSUPEN PEN NEEDLES) 32G X 4 MM MISC BD Pen Needles- brand specific. Inject insulin via insulin pen 6 x daily 05/26/17   Lelon Huh, MD  lidocaine-prilocaine (EMLA) cream Apply 1 application topically as needed. Patient not taking: Reported on 06/29/2019 12/22/17   Hermenia Bers, NP    Allergies    Patient has no known allergies.  Review of Systems   Review of Systems  Constitutional: Negative for chills and fever.  Gastrointestinal: Positive for abdominal pain, diarrhea, nausea and vomiting. Negative for constipation and hematochezia.  Genitourinary: Negative for dysuria, hematuria, vaginal bleeding and vaginal discharge.    Physical Exam Updated Vital Signs BP 125/78   Pulse (!) 116   Temp (!) 97.5 F (36.4 C)   Resp (!) 22   LMP 06/22/2019   SpO2 100%   Physical Exam Constitutional:      General: She is not in acute distress. HENT:     Head: Normocephalic and atraumatic.  Cardiovascular:     Rate and Rhythm: Regular rhythm. Tachycardia present.     Heart sounds: Normal heart sounds.  Pulmonary:     Effort: Pulmonary effort is normal.     Breath sounds: Normal breath sounds.  Abdominal:     General: Abdomen is flat.     Palpations: Abdomen is soft.     Tenderness: There is abdominal tenderness in the right lower quadrant. There is no right CVA tenderness or left CVA tenderness. Negative  signs include Murphy's sign, Rovsing's sign, McBurney's sign and psoas sign.     Hernia: No hernia is present.  Skin:    General: Skin is warm and dry.  Neurological:     General: No focal deficit present.     Mental Status: She is alert and oriented to person, place, and time.  Psychiatric:        Mood and Affect: Mood normal.        Behavior: Behavior normal.     ED Results / Procedures / Treatments   Labs (all labs ordered are listed, but only abnormal results are displayed) Labs Reviewed  COMPREHENSIVE METABOLIC PANEL - Abnormal; Notable for the following components:      Result Value   Potassium 3.4 (*)    CO2 21 (*)    Glucose, Bld 232 (*)    Calcium 8.8 (*)    All other components within normal limits  CBC - Abnormal; Notable for the following components:   WBC 13.7 (*)    Hemoglobin 9.0 (*)  HCT 31.9 (*)    MCV 65.6 (*)    MCH 18.5 (*)    MCHC 28.2 (*)    RDW 16.1 (*)    Platelets 444 (*)    All other components within normal limits  URINALYSIS, ROUTINE W REFLEX MICROSCOPIC - Abnormal; Notable for the following components:   APPearance HAZY (*)    Glucose, UA 150 (*)    Ketones, ur 20 (*)    Protein, ur 30 (*)    Leukocytes,Ua LARGE (*)    Bacteria, UA RARE (*)    All other components within normal limits  URINE CULTURE  LIPASE, BLOOD  I-STAT BETA HCG BLOOD, ED ([facility], [facility], [facility] ONLY)    EKG None  Radiology No results found.  Procedures Procedures (including critical care time)  Medications Ordered in ED Medications  0.9 %  sodium chloride infusion (1,000 mLs Intravenous New Bag/Given 06/29/19 1526)  ondansetron (ZOFRAN) injection 4 mg (4 mg Intravenous Given 06/29/19 1525)  morphine 4 MG/ML injection 4 mg (4 mg Intravenous Given 06/29/19 1523)    ED Course  I have reviewed the triage vital signs and the nursing notes.  Pertinent labs & imaging results that were available during my care of the patient were reviewed by me and considered in my  medical decision making (see chart for details).  Clinical Course as of Jun 29 1543  Thu Jun 29, 2019  1507 Comment 3:        [AT]  1509 Leukocytes,Ua(!): LARGE [AT]  1516 Hemoglobin(!): 9.0 [AT]    Clinical Course User Index [AT] Thompson, Aaron B, MD   MDM Rules/Calculators/A&P     CHA2DS2/VAS Stroke Risk Points      N/A >= 2 Points: High Risk  1 - 1.99 Points: Medium Risk  0 Points: Low Risk    A final score could not be computed because of missing components.: Last  Change: N/A     This score determines the patient's risk of having a stroke if the  patient has atrial fibrillation.      This score is not applicable to this patient. Components are not  calculated.                   Patient with right lower quadrant pain with vomiting.  Patient tachycardic, however remainder of vital signs are normal.  Labs significant for leukocytosis, microcytic anemia w/ Hg 9 unsure baseline. UA consistent with UTI, however presentation concerning for appendicitis, less likely ovarian torsion.  Patient NPO. Will get CT abdominal pelvis to evaluate further. Start mIVF. Patient declined vaginal exam. Did say that she would self.     Final Clinical Impression(s) / ED Diagnoses Final diagnoses:  RLQ abdominal pain  Nausea  Anemia, unspecified type    Rx / DC Orders ED Discharge Orders    None       Thompson, Aaron B, MD 06/29/19 1544    Floyd, Dan, DO 07/01/19 2304  

## 2019-06-29 NOTE — Anesthesia Procedure Notes (Deleted)
Procedure Name: Intubation Date/Time: 06/29/2019 7:50 PM Performed by: Anne Fu, CRNA Pre-anesthesia Checklist: Patient identified, Emergency Drugs available, Suction available, Patient being monitored and Timeout performed Patient Re-evaluated:Patient Re-evaluated prior to induction Oxygen Delivery Method: Circle system utilized Preoxygenation: Pre-oxygenation with 100% oxygen Induction Type: IV induction, Rapid sequence and Cricoid Pressure applied Laryngoscope Size: Mac and 4 Grade View: Grade I Tube type: Oral Tube size: 7.0 mm Number of attempts: 1 Airway Equipment and Method: Video-laryngoscopy and Rigid stylet Placement Confirmation: ETT inserted through vocal cords under direct vision,  positive ETCO2 and breath sounds checked- equal and bilateral Secured at: 19 cm Tube secured with: Tape Dental Injury: Teeth and Oropharynx as per pre-operative assessment

## 2019-06-29 NOTE — Anesthesia Preprocedure Evaluation (Addendum)
Anesthesia Evaluation  Patient identified by MRN, date of birth, ID band Patient awake    Reviewed: Allergy & Precautions, NPO status , Patient's Chart, lab work & pertinent test results  Airway Mallampati: II  TM Distance: >3 FB Neck ROM: Full    Dental no notable dental hx. (+) Teeth Intact, Dental Advisory Given   Pulmonary neg pulmonary ROS,    Pulmonary exam normal breath sounds clear to auscultation       Cardiovascular negative cardio ROS Normal cardiovascular exam Rhythm:Regular Rate:Normal     Neuro/Psych negative neurological ROS  negative psych ROS   GI/Hepatic negative GI ROS, Neg liver ROS,   Endo/Other  diabetes (insulin pump, Glu 232), Type 1, Insulin DependentHypothyroidism   Renal/GU negative Renal ROS  negative genitourinary   Musculoskeletal negative musculoskeletal ROS (+)   Abdominal   Peds  Hematology  (+) Blood dyscrasia (Hgb 9.0), anemia ,   Anesthesia Other Findings   Reproductive/Obstetrics                            Anesthesia Physical Anesthesia Plan  ASA: II and emergent  Anesthesia Plan: General   Post-op Pain Management:    Induction: Intravenous and Rapid sequence  PONV Risk Score and Plan: 3 and Midazolam, Dexamethasone and Ondansetron  Airway Management Planned: Oral ETT  Additional Equipment:   Intra-op Plan:   Post-operative Plan: Extubation in OR  Informed Consent: I have reviewed the patients History and Physical, chart, labs and discussed the procedure including the risks, benefits and alternatives for the proposed anesthesia with the patient or authorized representative who has indicated his/her understanding and acceptance.     Dental advisory given  Plan Discussed with: CRNA  Anesthesia Plan Comments:         Anesthesia Quick Evaluation

## 2019-06-29 NOTE — Telephone Encounter (Signed)
Dad lvm stating pt was taken to the ER and may be admitted to the hospital and wanted Spenser to know.

## 2019-06-30 ENCOUNTER — Encounter: Payer: Self-pay | Admitting: *Deleted

## 2019-06-30 DIAGNOSIS — Z9641 Presence of insulin pump (external) (internal): Secondary | ICD-10-CM

## 2019-06-30 DIAGNOSIS — E063 Autoimmune thyroiditis: Secondary | ICD-10-CM

## 2019-06-30 DIAGNOSIS — R739 Hyperglycemia, unspecified: Secondary | ICD-10-CM

## 2019-06-30 LAB — CBC
HCT: 31.9 % — ABNORMAL LOW (ref 36.0–46.0)
HCT: 29 % — ABNORMAL LOW (ref 36.0–46.0)
Hemoglobin: 8.8 g/dL — ABNORMAL LOW (ref 12.0–15.0)
Hemoglobin: 8.1 g/dL — ABNORMAL LOW (ref 12.0–15.0)
MCH: 18.8 pg — ABNORMAL LOW (ref 26.0–34.0)
MCH: 18.9 pg — ABNORMAL LOW (ref 26.0–34.0)
MCHC: 27.6 g/dL — ABNORMAL LOW (ref 30.0–36.0)
MCHC: 27.9 g/dL — ABNORMAL LOW (ref 30.0–36.0)
MCV: 68.2 fL — ABNORMAL LOW (ref 80.0–100.0)
MCV: 67.8 fL — ABNORMAL LOW (ref 80.0–100.0)
Platelets: 425 10*3/uL — ABNORMAL HIGH (ref 150–400)
Platelets: 440 10*3/uL — ABNORMAL HIGH (ref 150–400)
RBC: 4.68 MIL/uL (ref 3.87–5.11)
RBC: 4.28 MIL/uL (ref 3.87–5.11)
RDW: 16.5 % — ABNORMAL HIGH (ref 11.5–15.5)
RDW: 16.4 % — ABNORMAL HIGH (ref 11.5–15.5)
WBC: 31.7 10*3/uL — ABNORMAL HIGH (ref 4.0–10.5)
WBC: 31.3 10*3/uL — ABNORMAL HIGH (ref 4.0–10.5)
nRBC: 0 % (ref 0.0–0.2)
nRBC: 0 % (ref 0.0–0.2)

## 2019-06-30 LAB — GLUCOSE, CAPILLARY
Glucose-Capillary: 110 mg/dL — ABNORMAL HIGH (ref 70–99)
Glucose-Capillary: 146 mg/dL — ABNORMAL HIGH (ref 70–99)
Glucose-Capillary: 199 mg/dL — ABNORMAL HIGH (ref 70–99)
Glucose-Capillary: 200 mg/dL — ABNORMAL HIGH (ref 70–99)
Glucose-Capillary: 239 mg/dL — ABNORMAL HIGH (ref 70–99)
Glucose-Capillary: 272 mg/dL — ABNORMAL HIGH (ref 70–99)

## 2019-06-30 LAB — FERRITIN: Ferritin: 20 ng/mL (ref 11–307)

## 2019-06-30 LAB — IRON AND TIBC
Iron: 8 ug/dL — ABNORMAL LOW (ref 28–170)
Saturation Ratios: 2 % — ABNORMAL LOW (ref 10.4–31.8)
TIBC: 404 ug/dL (ref 250–450)
UIBC: 396 ug/dL

## 2019-06-30 LAB — RETICULOCYTES
Immature Retic Fract: 18 % — ABNORMAL HIGH (ref 2.3–15.9)
RBC.: 4.68 MIL/uL (ref 3.87–5.11)
Retic Count, Absolute: 52.4 10*3/uL (ref 19.0–186.0)
Retic Ct Pct: 1.1 % (ref 0.4–3.1)

## 2019-06-30 LAB — URINE CULTURE

## 2019-06-30 LAB — VITAMIN B12: Vitamin B-12: 267 pg/mL (ref 180–914)

## 2019-06-30 LAB — MAGNESIUM: Magnesium: 1.7 mg/dL (ref 1.7–2.4)

## 2019-06-30 LAB — FOLATE: Folate: 2.4 ng/mL — ABNORMAL LOW (ref >5.9)

## 2019-06-30 MED ORDER — INSULIN PUMP
Freq: Three times a day (TID) | SUBCUTANEOUS | Status: DC
  Administered 2019-06-30 – 2019-07-01 (×3): via SUBCUTANEOUS
  Administered 2019-07-01: 1 via SUBCUTANEOUS
  Administered 2019-07-01: 0.93 via SUBCUTANEOUS
  Administered 2019-07-02 (×2): via SUBCUTANEOUS
  Filled 2019-06-30: qty 1

## 2019-06-30 MED ORDER — FOLIC ACID 1 MG PO TABS
1.0000 mg | ORAL_TABLET | Freq: Every day | ORAL | Status: DC
  Administered 2019-06-30 – 2019-07-02 (×3): 1 mg via ORAL
  Filled 2019-06-30 (×3): qty 1

## 2019-06-30 MED ORDER — FERROUS GLUCONATE 324 (38 FE) MG PO TABS
324.0000 mg | ORAL_TABLET | Freq: Two times a day (BID) | ORAL | Status: DC
  Administered 2019-06-30 – 2019-07-02 (×4): 324 mg via ORAL
  Filled 2019-06-30 (×5): qty 1

## 2019-06-30 MED ORDER — POLYETHYLENE GLYCOL 3350 17 G PO PACK
17.0000 g | PACK | Freq: Every day | ORAL | Status: DC
  Administered 2019-06-30 – 2019-07-02 (×3): 17 g via ORAL
  Filled 2019-06-30 (×3): qty 1

## 2019-06-30 NOTE — Progress Notes (Signed)
Pharmacy: entereg  S/p lap appendectomy on 12/10.  Entereg ordered post-op.   - Pre-op dose was not ordered for patient - Per [employer] policy, patient disqualifies from receiving any post-op doses if pre-op dose was not ordered.  Plan: - d/ced post-op entereg order  Dia Sitter, PharmD, BCPS 06/30/2019 9:42 AM

## 2019-06-30 NOTE — Progress Notes (Signed)
Inpatient Diabetes Program Recommendations  AACE/ADA: New Consensus Statement on Inpatient Glycemic Control (2015)  Target Ranges:  Prepandial:   less than 140 mg/dL      Peak postprandial:   less than 180 mg/dL (1-2 hours)      Critically ill patients:  140 - 180 mg/dL   Lab Results  Component Value Date   GLUCAP 110 (H) 06/30/2019   HGBA1C 8.0 (H) 06/29/2019    Review of Glycemic Control  Diabetes history: DM1 Outpatient Diabetes medications: Insulin pump Current orders for Inpatient glycemic control: Insulin pump  HgbA1C - 8.0% Pt had insulin pump and supplies. Restarted after lunch today Endo - Alwyn Ren, NP Spoke with pt and father regarding insulin pump policy.   Inpatient Diabetes Program Recommendations:     Tslim insulin pump  Basal rates: 12AM - 0.95 0400 -  1.025 0600 -  0.975 2100 -  0.95  Insulin to CHO Ratio: 12am - 10 0600  - 9  Insulin Sensitivity Factor 30  Target Blood Glucose 12AM - 150 0600  - 120 2100  - 150  Insulin pump order set in place. Discussed with RN.  Thank you. Lorenda Peck, RD, LDN, CDE Inpatient Diabetes Coordinator [phone]

## 2019-06-30 NOTE — Progress Notes (Signed)
Patient ID: Elizabeth Stephenson, female   DOB: 2001-05-02, 18 y.o.   MRN: [id number]    1 Day Post-Op  Subjective: Patient feels well this morning.  Not having much pain.  No nausea.  No flatus.  Foley in place.  Patient states she has a history of anemia maybe but was never worked up.  She denies heavy menstrual cycles.  ROS: See above, otherwise other systems negative  Objective: Vital signs in last 24 hours: Temp:  [97.5 F (36.4 C)-99.7 F (37.6 C)] 98 F (36.7 C) (12/11 0616) Pulse Rate:  [108-136] 118 (12/11 0616) Resp:  [7-23] 18 (12/11 0616) BP: (108-129)/(52-82) 122/67 (12/11 0616) SpO2:  [93 %-100 %] 100 % (12/11 0616) Weight:  [78 kg-81.3 kg] 81.3 kg (12/11 0500) Last BM Date: 06/29/19  Intake/Output from previous day: 12/10 0701 - 12/11 0700 In: 4370 [P.O.:120; I.V.:3000; IV Piggyback:1250] Out: 2202 [Urine:2350; Blood:25] Intake/Output this shift: No intake/output data recorded.  PE: Heart: regular, but tachy still Lungs: CTAB Abd: soft, appropriately tender, incisions are c/d/i with gauze and tegaderm present, hypoactive BS, ND GU: foley in place with clear yellow urine  Lab Results:  Recent Labs    06/29/19 1149 06/30/19 0552  WBC 13.7* 31.7*  HGB 9.0* 8.8*  HCT 31.9* 31.9*  PLT 444* 425*   BMET Recent Labs    06/29/19 1149  NA 136  K 3.4*  CL 102  CO2 21*  GLUCOSE 232*  BUN 9  CREATININE 0.52  CALCIUM 8.8*   PT/INR No results for input(s): LABPROT, INR in the last 72 hours. CMP     Component Value Date/Time   NA 136 06/29/2019 1149   K 3.4 (L) 06/29/2019 1149   CL 102 06/29/2019 1149   CO2 21 (L) 06/29/2019 1149   GLUCOSE 232 (H) 06/29/2019 1149   BUN 9 06/29/2019 1149   CREATININE 0.52 06/29/2019 1149   CALCIUM 8.8 (L) 06/29/2019 1149   PROT 7.2 06/29/2019 1149   ALBUMIN 3.5 06/29/2019 1149   AST 16 06/29/2019 1149   ALT 14 06/29/2019 1149   ALKPHOS 82 06/29/2019 1149   BILITOT 0.3 06/29/2019 1149   GFRNONAA >60 06/29/2019 1149     GFRAA >60 06/29/2019 1149   Lipase     Component Value Date/Time   LIPASE 19 06/29/2019 1149       Studies/Results: CT Abdomen Pelvis W Contrast  Result Date: 06/29/2019 CLINICAL DATA:  Right lower quadrant pain with nausea and vomiting EXAM: CT ABDOMEN AND PELVIS WITH CONTRAST TECHNIQUE: Multidetector CT imaging of the abdomen and pelvis was performed using the standard protocol following bolus administration of intravenous contrast. CONTRAST:  176mL OMNIPAQUE IOHEXOL 300 MG/ML  SOLN COMPARISON:  None. FINDINGS: Lower chest: No acute abnormality. Hepatobiliary: No focal liver abnormality is seen. No gallstones, gallbladder wall thickening, or biliary dilatation. Pancreas: Unremarkable. No pancreatic ductal dilatation or surrounding inflammatory changes. Spleen: Normal in size without focal abnormality. Adrenals/Urinary Tract: Adrenal glands are within normal limits. Kidneys are well visualized bilaterally. No renal calculi or obstructive changes are seen. The ureters are within normal limits to the bladder. The bladder is partially distended. Stomach/Bowel: Fluid-filled loops of colon are noted without obstructive change. No small bowel obstructive changes are noted. Stomach is within normal limits. There are changes consistent with acute appendicitis. Appendix: Location: Infra cecal and medial Diameter: 16 mm Appendicolith: Absent Mucosal hyper-enhancement: Present Extraluminal gas: Small foci of extraluminal air is noted. Periappendiceal collection: Periappendiceal inflammatory changes are noted with a small  amount of fluid just below the cecal tip although no demonstrable abscess is seen. Some reactive inflammatory changes of the terminal ileum are noted. Vascular/Lymphatic: No significant vascular findings are present. No enlarged abdominal or pelvic lymph nodes. Reproductive: Uterus and bilateral adnexa are unremarkable. Other: No hernia is seen. A moderate amount of free pelvic fluid is  noted. Musculoskeletal: No acute or significant osseous findings. IMPRESSION: Changes consistent with acute appendicitis with evidence of free pelvic fluid as well as a small amount of periappendiceal fluid and inflammatory change. A small focus of extraluminal air is noted as well likely related to micro perforation. Some reactive inflammatory changes in the distal ileum are seen as well. No other focal abnormality is noted. Critical Value/emergent results were called by telephone at the time of interpretation on 06/29/2019 at 4:33 pm to [facility], [place], who verbally acknowledged these results. Electronically Signed   By: Alcide Clever M.D.   On: 06/29/2019 16:34    Anti-infectives: Anti-infectives (From admission, onward)   Start     Dose/Rate Route Frequency Ordered Stop   06/29/19 2359  piperacillin-tazobactam (ZOSYN) IVPB 3.375 g     3.375 g 12.5 mL/hr over 240 Minutes Intravenous Every 8 hours 06/29/19 2242 07/04/19 2359   06/29/19 2315  fluconazole (DIFLUCAN) tablet 200 mg     200 mg Oral  Once 06/29/19 2242 06/29/19 2335   06/29/19 2245  cefoTEtan (CEFOTAN) 2 g in sodium chloride 0.9 % 100 mL IVPB  Status:  Discontinued     2 g 200 mL/hr over 30 Minutes Intravenous Every 12 hours 06/29/19 2242 06/29/19 2305   06/29/19 1830  cefTRIAXone (ROCEPHIN) 2 g in sodium chloride 0.9 % 100 mL IVPB     2 g 200 mL/hr over 30 Minutes Intravenous On call to O.R. 06/29/19 1737 06/29/19 2055   06/29/19 1830  metroNIDAZOLE (FLAGYL) IVPB 500 mg     500 mg 100 mL/hr over 60 Minutes Intravenous On call to O.R. 06/29/19 1737 06/29/19 2039   06/29/19 1645  piperacillin-tazobactam (ZOSYN) IVPB 3.375 g     3.375 g 100 mL/hr over 30 Minutes Intravenous  Once 06/29/19 1641 06/29/19 1732       Assessment/Plan Type I diabetes - appreciate medicine assistance.  hgbA1C 8, will need endo follow up as outpatient Anemia - presenting hgb 9.  hgb today in the 8s.  Patient denies heavy menstrual cycles.  No other clear  source.  Will check anemia panel  POD 1, s/p lap appy for acute perforated appendicitis with purulent peritonitis, Dr. Michaell Cowing 06/29/19 -CLD today, await better bowel function -cont zosyn -mobilize and pulm toilet -DC foley today -WBC increased to almost 32K today from 13K, likely reactive from surgery but will recheck just to clarify.  No other acute intervention warranted for that at this time as patient is improving and on abx and increase in WBC not unexpected post op.  FEN - CLD, IVFs VTE - Lovenox ID - Zosyn 12/10 --> Follow up: [facility] as of now   LOS: 1 day    Letha Cape , [facility] 06/30/2019, 10:26 AM Please see Amion for pager number during day hours 7:00am-4:30pm

## 2019-06-30 NOTE — Progress Notes (Addendum)
PROGRESS NOTE    Elizabeth DoyneJoylynn K Saliba    Code Status: Full Code  XBM:[id number] DOB: 06-09-2001 DOA: 06/29/2019  PCP: Johny BlamerHarris, William, MD    Hospital Summary  18 year old female with past medical history of type 1 diabetes on insulin pump follows with endocrinology, hypothyroidism presented to the ED for evaluation of right lower quadrant abdominal pain found to have acute appendicitis on CT scan and started on IV Zosyn.  General surgery is the primary team and performed laparoscopic appendectomy for acute perforated appendicitis with focal gangrene and diffuse peritonitis on 12/10 with Dr. Michaell CowingGross.  [facility] consulted on 12/10 for glucose management.  A & P   Principal Problem:   Acute appendicitis with perforation and localized peritonitis Active Problems:   Type 1 diabetes mellitus on insulin therapy (HCC)   Hypothyroidism, acquired, autoimmune   Hyperglycemia   Insulin pump in place   Tachycardia   Acute gangrenous appendicitis with perforation and peritonitis   Acute appendicitis with perforation and focal gangrene with diffuse peritonitis status post laparoscopic appendectomy on 12/10 with Dr. Michaell CowingGross, POD 1 WBC 13->32 -On Zosyn -Management per general surgery (primary team)  Iron deficiency anemia  Unknown etiology or baseline.  Hb 8.1 with MCV 68.    Type 1 diabetes on insulin pump Follows with endocrinology outpatient.  HA1C 8.0 however this can be falsely elevated with iron deficiency anemia. -Continue sliding scale while inpatient -Correct her underlying iron deficiency -Diabetic coordinator consulted to assist with insulin pump recommendations at discharge and outpatient follow-up  Hypothyroidism Stable -Continue Synthroid   Subjective   Patient seen and examined at bedside in no acute distress and resting comfortably. No acute events overnight. Denies any acute complaints at this time.  States that she had a follow-up appointment with endocrinology in  September and does not recall last time she saw her PCP.  No other complaints.  Objective   Vitals:   06/30/19 0158 06/30/19 0500 06/30/19 0616 06/30/19 1005  BP: (!) 108/52  122/67 129/81  Pulse: (!) 124  (!) 118 (!) 101  Resp: 19  18 20   Temp: 98.1 F (36.7 C)  98 F (36.7 C) 98.1 F (36.7 C)  TempSrc: Oral  Oral Oral  SpO2: 96%  100% 100%  Weight:  81.3 kg    Height:        Intake/Output Summary (Last 24 hours) at 06/30/2019 1304 Last data filed at 06/30/2019 1139 Gross per 24 hour  Intake 4842 ml  Output 2375 ml  Net 2467 ml   Filed Weights   06/29/19 1929 06/30/19 0500  Weight: 78 kg 81.3 kg    Examination:  Physical Exam Vitals and nursing note reviewed.  Constitutional:      Appearance: Normal appearance. She is well-developed.  HENT:     Head: Normocephalic and atraumatic.     Nose: Nose normal.     Mouth/Throat:     Mouth: Mucous membranes are moist.  Cardiovascular:     Rate and Rhythm: Regular rhythm. Tachycardia present.  Pulmonary:     Effort: Pulmonary effort is normal.     Breath sounds: Normal breath sounds.  Abdominal:     General: Abdomen is flat. There is no distension.     Palpations: Abdomen is soft.  Genitourinary:    Comments: Foley catheter Musculoskeletal:        General: No swelling. Normal range of motion.     Cervical back: Normal range of motion. No rigidity.  Neurological:  Mental Status: She is alert. Mental status is at baseline.  Psychiatric:        Mood and Affect: Mood normal.        Behavior: Behavior normal.     Data Reviewed: I have personally reviewed following labs and imaging studies  CBC: Recent Labs  Lab 06/29/19 1149 06/30/19 0552 06/30/19 1101  WBC 13.7* 31.7* 31.3*  HGB 9.0* 8.8* 8.1*  HCT 31.9* 31.9* 29.0*  MCV 65.6* 68.2* 67.8*  PLT 444* 425* 440*   Basic Metabolic Panel: Recent Labs  Lab 06/29/19 1149 06/30/19 0552  NA 136  --   K 3.4*  --   CL 102  --   CO2 21*  --   GLUCOSE 232*   --   BUN 9  --   CREATININE 0.52  --   CALCIUM 8.8*  --   MG  --  1.7   GFR: Estimated Creatinine Clearance: 114.3 mL/min (by C-G formula based on SCr of 0.52 mg/dL). Liver Function Tests: Recent Labs  Lab 06/29/19 1149  AST 16  ALT 14  ALKPHOS 82  BILITOT 0.3  PROT 7.2  ALBUMIN 3.5   Recent Labs  Lab 06/29/19 1149  LIPASE 19   No results for input(s): AMMONIA in the last 168 hours. Coagulation Profile: No results for input(s): INR, PROTIME in the last 168 hours. Cardiac Enzymes: No results for input(s): CKTOTAL, CKMB, CKMBINDEX, TROPONINI in the last 168 hours. BNP (last 3 results) No results for input(s): PROBNP in the last 8760 hours. HbA1C: Recent Labs    06/29/19 1200  HGBA1C 8.0*   CBG: Recent Labs  Lab 06/29/19 2145 06/30/19 0009 06/30/19 0357 06/30/19 0746 06/30/19 1153  GLUCAP 150* 239* 272* 200* 199*   Lipid Profile: No results for input(s): CHOL, HDL, LDLCALC, TRIG, CHOLHDL, LDLDIRECT in the last 72 hours. Thyroid Function Tests: No results for input(s): TSH, T4TOTAL, FREET4, T3FREE, THYROIDAB in the last 72 hours. Anemia Panel: Recent Labs    06/30/19 1000 06/30/19 1101  VITAMINB12  --  267  FOLATE  --  2.4*  FERRITIN  --  20  TIBC  --  404  IRON  --  8*  RETICCTPCT 1.1  --    Sepsis Labs: No results for input(s): PROCALCITON, LATICACIDVEN in the last 168 hours.  Recent Results (from the past 240 hour(s))  Urine culture     Status: Abnormal   Collection Time: 06/29/19 12:04 PM   Specimen: Urine, Random  Result Value Ref Range Status   Specimen Description   Final    URINE, RANDOM Performed at [facility], [address] [place], [place] [address]    Special Requests   Final    NONE Performed at [facility], [address] [place], [place] [address]    Culture MULTIPLE SPECIES PRESENT, SUGGEST RECOLLECTION (A)  Final   Report Status 06/30/2019 FINAL  Final  Respiratory Panel by RT  PCR (Flu A&B, Covid) - Nasopharyngeal Swab     Status: None   Collection Time: 06/29/19  5:06 PM   Specimen: Nasopharyngeal Swab  Result Value Ref Range Status   SARS Coronavirus 2 by RT PCR NEGATIVE NEGATIVE Final    Comment: (NOTE) SARS-CoV-2 target nucleic acids are NOT DETECTED. The SARS-CoV-2 RNA is generally detectable in upper respiratoy specimens during the acute phase of infection. The lowest concentration of SARS-CoV-2 viral copies this assay can detect is 131 copies/mL. A negative result does not preclude SARS-Cov-2 infection  and should not be used as the sole basis for treatment or other patient management decisions. A negative result may occur with  improper specimen collection/handling, submission of specimen other than nasopharyngeal swab, presence of viral mutation(s) within the areas targeted by this assay, and inadequate number of viral copies (<131 copies/mL). A negative result must be combined with clinical observations, patient history, and epidemiological information. The expected result is Negative. Fact Sheet for Patients:  https://www.moore.com/ Fact Sheet for Healthcare Providers:  https://www.young.biz/ This test is not yet ap proved or cleared by the [place] FDA and  has been authorized for detection and/or diagnosis of SARS-CoV-2 by FDA under an Emergency Use Authorization (EUA). This EUA will remain  in effect (meaning this test can be used) for the duration of the COVID-19 declaration under Section 564(b)(1) of the Act, 21 U.S.C. section 360bbb-3(b)(1), unless the authorization is terminated or revoked sooner.    Influenza A by PCR NEGATIVE NEGATIVE Final   Influenza B by PCR NEGATIVE NEGATIVE Final    Comment: (NOTE) The Xpert Xpress SARS-CoV-2/FLU/RSV assay is intended as an aid in  the diagnosis of influenza from Nasopharyngeal swab specimens and  should not be used as a sole basis for treatment. Nasal  washings and  aspirates are unacceptable for Xpert Xpress SARS-CoV-2/FLU/RSV  testing. Fact Sheet for Patients: https://www.moore.com/ Fact Sheet for Healthcare Providers: https://www.young.biz/ This test is not yet approved or cleared by the [place] FDA and  has been authorized for detection and/or diagnosis of SARS-CoV-2 by  FDA under an Emergency Use Authorization (EUA). This EUA will remain  in effect (meaning this test can be used) for the duration of the  Covid-19 declaration under Section 564(b)(1) of the Act, 21  U.S.C. section 360bbb-3(b)(1), unless the authorization is  terminated or revoked. Performed at [facility], [address], [place], [place] [address]          Radiology Studies: CT Abdomen Pelvis W Contrast  Result Date: 06/29/2019 CLINICAL DATA:  Right lower quadrant pain with nausea and vomiting EXAM: CT ABDOMEN AND PELVIS WITH CONTRAST TECHNIQUE: Multidetector CT imaging of the abdomen and pelvis was performed using the standard protocol following bolus administration of intravenous contrast. CONTRAST:  OMNIPAQUE IOHEXOL 300 MG/ML  SOLN COMPARISON:  None. FINDINGS: Lower chest: No acute abnormality. Hepatobiliary: No focal liver abnormality is seen. No gallstones, gallbladder wall thickening, or biliary dilatation. Pancreas: Unremarkable. No pancreatic ductal dilatation or surrounding inflammatory changes. Spleen: Normal in size without focal abnormality. Adrenals/Urinary Tract: Adrenal glands are within normal limits. Kidneys are well visualized bilaterally. No renal calculi or obstructive changes are seen. The ureters are within normal limits to the bladder. The bladder is partially distended. Stomach/Bowel: Fluid-filled loops of colon are noted without obstructive change. No small bowel obstructive changes are noted. Stomach is within normal limits. There are changes consistent with acute  appendicitis. Appendix: Location: Infra cecal and medial Diameter: 16 mm Appendicolith: Absent Mucosal hyper-enhancement: Present Extraluminal gas: Small foci of extraluminal air is noted. Periappendiceal collection: Periappendiceal inflammatory changes are noted with a small amount of fluid just below the cecal tip although no demonstrable abscess is seen. Some reactive inflammatory changes of the terminal ileum are noted. Vascular/Lymphatic: No significant vascular findings are present. No enlarged abdominal or pelvic lymph nodes. Reproductive: Uterus and bilateral adnexa are unremarkable. Other: No hernia is seen. A moderate amount of free pelvic fluid is noted. Musculoskeletal: No acute or significant osseous findings. IMPRESSION: Changes consistent with acute  appendicitis with evidence of free pelvic fluid as well as a small amount of periappendiceal fluid and inflammatory change. A small focus of extraluminal air is noted as well likely related to micro perforation. Some reactive inflammatory changes in the distal ileum are seen as well. No other focal abnormality is noted. Critical Value/emergent results were called by telephone at the time of interpretation on 06/29/2019 at 4:33 pm to [facility], [place], who verbally acknowledged these results. Electronically Signed   By: Inez Catalina M.D.   On: 06/29/2019 16:34        Scheduled Meds: . acetaminophen  1,000 mg Oral Q6H  . bupivacaine liposome  20 mL Infiltration Once  . Chlorhexidine Gluconate Cloth  6 each Topical Once  . enoxaparin (LOVENOX) injection  40 mg Subcutaneous Daily  . gabapentin  200 mg Oral TID  . insulin aspart  0-15 Units Subcutaneous Q4H  . levothyroxine  75 mcg Oral QAC breakfast  . lip balm  1 application Topical BID   Continuous Infusions: . sodium chloride    . lactated ringers    . lactated ringers 50 mL/hr at 06/29/19 2334  . methocarbamol (ROBAXIN) IV    . ondansetron (ZOFRAN) IV    . piperacillin-tazobactam (ZOSYN)   IV 3.375 g (06/30/19 1055)     LOS: 1 day    Time spent: 20 minutes with over 50% of the time coordinating the patient's care    Harold Hedge, DO [facility] Pager [phone]  If 7PM-7AM, please contact night-coverage [email or web address] Password [email or web address] 06/30/2019, 1:04 PM

## 2019-07-01 DIAGNOSIS — R000 Tachycardia, unspecified: Secondary | ICD-10-CM

## 2019-07-01 LAB — GLUCOSE, CAPILLARY
Glucose-Capillary: 163 mg/dL — ABNORMAL HIGH (ref 70–99)
Glucose-Capillary: 168 mg/dL — ABNORMAL HIGH (ref 70–99)
Glucose-Capillary: 161 mg/dL — ABNORMAL HIGH (ref 70–99)
Glucose-Capillary: 122 mg/dL — ABNORMAL HIGH (ref 70–99)
Glucose-Capillary: 82 mg/dL (ref 70–99)

## 2019-07-01 LAB — CBC
HCT: 27.7 % — ABNORMAL LOW (ref 36.0–46.0)
Hemoglobin: 7.6 g/dL — ABNORMAL LOW (ref 12.0–15.0)
MCH: 18.5 pg — ABNORMAL LOW (ref 26.0–34.0)
MCHC: 27.4 g/dL — ABNORMAL LOW (ref 30.0–36.0)
MCV: 67.4 fL — ABNORMAL LOW (ref 80.0–100.0)
Platelets: 426 10*3/uL — ABNORMAL HIGH (ref 150–400)
RBC: 4.11 MIL/uL (ref 3.87–5.11)
RDW: 16.6 % — ABNORMAL HIGH (ref 11.5–15.5)
WBC: 18.5 10*3/uL — ABNORMAL HIGH (ref 4.0–10.5)
nRBC: 0 % (ref 0.0–0.2)

## 2019-07-01 NOTE — Progress Notes (Addendum)
PROGRESS NOTE    Elizabeth Stephenson    Code Status: Full Code  ATF:[id number] DOB: [birth year] DOA: 06/29/2019  PCP: [name], MD    Hospital Summary  18 year old female with past medical history of type 1 diabetes on insulin pump follows with endocrinology, hypothyroidism presented to the ED for evaluation of right lower quadrant abdominal pain found to have acute appendicitis on CT scan and started on IV Zosyn.  General surgery is the primary team and performed laparoscopic appendectomy for acute perforated appendicitis with focal gangrene and diffuse peritonitis on 12/10 with Dr. Michaell Cowing.  [facility] consulted on 12/10 for glucose management.  A & P   Principal Problem:   Acute appendicitis with perforation and localized peritonitis Active Problems:   Type 1 diabetes mellitus on insulin therapy (HCC)   Hypothyroidism, acquired, autoimmune   Hyperglycemia   Insulin pump in place   Tachycardia   Acute gangrenous appendicitis with perforation and peritonitis   Acute appendicitis with perforation and focal gangrene with diffuse peritonitis status post laparoscopic appendectomy on 12/10 with Dr. Michaell Cowing, POD 2 WBC 13->31->18. Afebrile.  On 2 L cannula -On Zosyn -Pain management per primary team -Would try to wean off oxygen  Microcytic anemia secondary to iron deficiency anemia  Hemoglobin continues to decrease, 8.1-7.6 today.  Significant iron deficiency would not typically be expected in a patient of this age unless heavy menstrual cycles or other bleeding, which the patient is not having.  Also has borderline folic acid deficiency and borderline low Vitamin B12 deficiency (267).  This suggests she may have an underlying malabsorptive issue.  Consider celiac disease or IBD in a young female patient with multiple other autoimmune diseases.   -will add on tTG, MMA and homocysteine for a.m. labs.  -consider screening for other vitamin deficiencies and further workup  outpatient -continue iron therapy  Type 1 diabetes on insulin pump Follows with endocrinology outpatient.  HA1C 8.0 however this can be falsely elevated with iron deficiency anemia. -restarted on insulin pump with pump recommendations by diabetic coordinator -correct anemia  Hypothyroidism Stable -Continue Synthroid  Sinus Tachycardia Concern for irregular rhythm at bedside ausculation with third heart sound auscultated. EKG performed showing Sinus tachycardia and nonspecific T wave changes in Inferior leads. Sinus tach likely from underlying medical conditions as above. Third heart sound may be benign valvular closure -Continue to treat underlying condition per primary team   Subjective   No complaints other than some abdominal soreness post surgery. Denies heavy menstrual cycle and admits to regular menstrual cycle. No reported signs of GI bleeding. States she has a history of anemia several years ago but never did anything about it. No other complaints    Objective   Vitals:   06/30/19 1405 06/30/19 2029 07/01/19 0333 07/01/19 0500  BP: 133/79 112/64 121/78   Pulse: 94 (!) 115 (!) 106   Resp: Temp: 98 F (36.7 C) 98.6 F (37 C) 98.2 F (36.8 C)   TempSrc: Oral Oral Oral   SpO2: 99% 100% 98%   Weight:    83.3 kg  Height:        Intake/Output Summary (Last 24 hours) at 07/01/2019 1336 Last data filed at 07/01/2019 0500 Gross per 24 hour  Intake 790 ml  Output 0 ml  Net 790 ml   Filed Weights   06/29/19 1929 06/30/19 0500 07/01/19 0500  Weight: 78 kg 81.3 kg 83.3 kg    Examination:  Physical Exam Vitals and  nursing note reviewed.  Constitutional:      Appearance: Normal appearance.  HENT:     Head: Normocephalic and atraumatic.     Nose: Nose normal.     Mouth/Throat:     Mouth: Mucous membranes are moist.  Eyes:     Extraocular Movements: Extraocular movements intact.  Cardiovascular:     Rate and Rhythm: Tachycardia present.     Heart  sounds: No murmur.     Comments: S1 and S2 auscultated. An unknown third heart sound was auscultated as well almost like a gallop Pulmonary:     Effort: Pulmonary effort is normal.     Breath sounds: Normal breath sounds.  Abdominal:     General: Abdomen is flat.     Palpations: Abdomen is soft.  Musculoskeletal:        General: No swelling. Normal range of motion.     Cervical back: Normal range of motion. No rigidity.  Neurological:     General: No focal deficit present.     Mental Status: She is alert. Mental status is at baseline.  Psychiatric:        Mood and Affect: Mood normal.        Behavior: Behavior normal.     Data Reviewed: I have personally reviewed following labs and imaging studies  CBC: Recent Labs  Lab 06/29/19 1149 06/30/19 0552 06/30/19 1101 07/01/19 0548  WBC 13.7* 31.7* 31.3* 18.5*  HGB 9.0* 8.8* 8.1* 7.6*  HCT 31.9* 31.9* 29.0* 27.7*  MCV 65.6* 68.2* 67.8* 67.4*  PLT 444* 425* 440* 426*   Basic Metabolic Panel: Recent Labs  Lab 06/29/19 1149 06/30/19 0552  NA 136  --   K 3.4*  --   CL 102  --   CO2 21*  --   GLUCOSE 232*  --   BUN 9  --   CREATININE 0.52  --   CALCIUM 8.8*  --   MG  --  1.7   GFR: Estimated Creatinine Clearance: 115.7 mL/min (by C-G formula based on SCr of 0.52 mg/dL). Liver Function Tests: Recent Labs  Lab 06/29/19 1149  AST 16  ALT 14  ALKPHOS 82  BILITOT 0.3  PROT 7.2  ALBUMIN 3.5   Recent Labs  Lab 06/29/19 1149  LIPASE 19   No results for input(s): AMMONIA in the last 168 hours. Coagulation Profile: No results for input(s): INR, PROTIME in the last 168 hours. Cardiac Enzymes: No results for input(s): CKTOTAL, CKMB, CKMBINDEX, TROPONINI in the last 168 hours. BNP (last 3 results) No results for input(s): PROBNP in the last 8760 hours. HbA1C: Recent Labs    06/29/19 1200  HGBA1C 8.0*   CBG: Recent Labs  Lab 06/30/19 1653 06/30/19 2201 07/01/19 0330 07/01/19 0757 07/01/19 1158  GLUCAP  110* 146* 163* 168* 161*   Lipid Profile: No results for input(s): CHOL, HDL, LDLCALC, TRIG, CHOLHDL, LDLDIRECT in the last 72 hours. Thyroid Function Tests: No results for input(s): TSH, T4TOTAL, FREET4, T3FREE, THYROIDAB in the last 72 hours. Anemia Panel: Recent Labs    06/30/19 1000 06/30/19 1101  VITAMINB12  --  267  FOLATE  --  2.4*  FERRITIN  --  20  TIBC  --  404  IRON  --  8*  RETICCTPCT 1.1  --    Sepsis Labs: No results for input(s): PROCALCITON, LATICACIDVEN in the last 168 hours.  Recent Results (from the past 240 hour(s))  Urine culture     Status: Abnormal   Collection  Time: 06/29/19 12:04 PM   Specimen: Urine, Random  Result Value Ref Range Status   Specimen Description   Final    URINE, RANDOM Performed at [facility], [address], [place], [place] [address]    Special Requests   Final    NONE Performed at [facility], [address], [place], [place] [address]    Culture MULTIPLE SPECIES PRESENT, SUGGEST RECOLLECTION (A)  Final   Report Status 06/30/2019 FINAL  Final  Respiratory Panel by RT PCR (Flu A&B, Covid) - Nasopharyngeal Swab     Status: None   Collection Time: 06/29/19  5:06 PM   Specimen: Nasopharyngeal Swab  Result Value Ref Range Status   SARS Coronavirus 2 by RT PCR NEGATIVE NEGATIVE Final    Comment: (NOTE) SARS-CoV-2 target nucleic acids are NOT DETECTED. The SARS-CoV-2 RNA is generally detectable in upper respiratoy specimens during the acute phase of infection. The lowest concentration of SARS-CoV-2 viral copies this assay can detect is 131 copies/mL. A negative result does not preclude SARS-Cov-2 infection and should not be used as the sole basis for treatment or other patient management decisions. A negative result may occur with  improper specimen collection/handling, submission of specimen other than nasopharyngeal swab, presence of viral mutation(s) within the areas targeted by this  assay, and inadequate number of viral copies (<131 copies/mL). A negative result must be combined with clinical observations, patient history, and epidemiological information. The expected result is Negative. Fact Sheet for Patients:  https://www.moore.com/ Fact Sheet for Healthcare Providers:  https://www.young.biz/ This test is not yet ap proved or cleared by the [place] FDA and  has been authorized for detection and/or diagnosis of SARS-CoV-2 by FDA under an Emergency Use Authorization (EUA). This EUA will remain  in effect (meaning this test can be used) for the duration of the COVID-19 declaration under Section 564(b)(1) of the Act, 21 U.S.C. section 360bbb-3(b)(1), unless the authorization is terminated or revoked sooner.    Influenza A by PCR NEGATIVE NEGATIVE Final   Influenza B by PCR NEGATIVE NEGATIVE Final    Comment: (NOTE) The Xpert Xpress SARS-CoV-2/FLU/RSV assay is intended as an aid in  the diagnosis of influenza from Nasopharyngeal swab specimens and  should not be used as a sole basis for treatment. Nasal washings and  aspirates are unacceptable for Xpert Xpress SARS-CoV-2/FLU/RSV  testing. Fact Sheet for Patients: https://www.moore.com/ Fact Sheet for Healthcare Providers: https://www.young.biz/ This test is not yet approved or cleared by the [place] FDA and  has been authorized for detection and/or diagnosis of SARS-CoV-2 by  FDA under an Emergency Use Authorization (EUA). This EUA will remain  in effect (meaning this test can be used) for the duration of the  Covid-19 declaration under Section 564(b)(1) of the Act, 21  U.S.C. section 360bbb-3(b)(1), unless the authorization is  terminated or revoked. Performed at [facility], [address] [place], [place] [address]          Radiology Studies: CT Abdomen Pelvis W Contrast  Result Date:  06/29/2019 CLINICAL DATA:  Right lower quadrant pain with nausea and vomiting EXAM: CT ABDOMEN AND PELVIS WITH CONTRAST TECHNIQUE: Multidetector CT imaging of the abdomen and pelvis was performed using the standard protocol following bolus administration of intravenous contrast. CONTRAST:  OMNIPAQUE IOHEXOL 300 MG/ML  SOLN COMPARISON:  None. FINDINGS: Lower chest: No acute abnormality. Hepatobiliary: No focal liver abnormality is seen. No gallstones, gallbladder wall thickening, or biliary dilatation. Pancreas: Unremarkable.  No pancreatic ductal dilatation or surrounding inflammatory changes. Spleen: Normal in size without focal abnormality. Adrenals/Urinary Tract: Adrenal glands are within normal limits. Kidneys are well visualized bilaterally. No renal calculi or obstructive changes are seen. The ureters are within normal limits to the bladder. The bladder is partially distended. Stomach/Bowel: Fluid-filled loops of colon are noted without obstructive change. No small bowel obstructive changes are noted. Stomach is within normal limits. There are changes consistent with acute appendicitis. Appendix: Location: Infra cecal and medial Diameter: 16 mm Appendicolith: Absent Mucosal hyper-enhancement: Present Extraluminal gas: Small foci of extraluminal air is noted. Periappendiceal collection: Periappendiceal inflammatory changes are noted with a small amount of fluid just below the cecal tip although no demonstrable abscess is seen. Some reactive inflammatory changes of the terminal ileum are noted. Vascular/Lymphatic: No significant vascular findings are present. No enlarged abdominal or pelvic lymph nodes. Reproductive: Uterus and bilateral adnexa are unremarkable. Other: No hernia is seen. A moderate amount of free pelvic fluid is noted. Musculoskeletal: No acute or significant osseous findings. IMPRESSION: Changes consistent with acute appendicitis with evidence of free pelvic fluid as well as a small  amount of periappendiceal fluid and inflammatory change. A small focus of extraluminal air is noted as well likely related to micro perforation. Some reactive inflammatory changes in the distal ileum are seen as well. No other focal abnormality is noted. Critical Value/emergent results were called by telephone at the time of interpretation on 06/29/2019 at 4:33 pm to [facility], [place], who verbally acknowledged these results. Electronically Signed   By: Inez Catalina M.D.   On: 06/29/2019 16:34        Scheduled Meds: . acetaminophen  1,000 mg Oral Q6H  . bupivacaine liposome  20 mL Infiltration Once  . Chlorhexidine Gluconate Cloth  6 each Topical Once  . enoxaparin (LOVENOX) injection  40 mg Subcutaneous Daily  . ferrous gluconate  324 mg Oral BID WC  . folic acid  1 mg Oral Daily  . gabapentin  200 mg Oral TID  . insulin pump   Subcutaneous TID WC, HS, 0200  . levothyroxine  75 mcg Oral QAC breakfast  . lip balm  1 application Topical BID  . polyethylene glycol  17 g Oral Daily   Continuous Infusions: . lactated ringers    . methocarbamol (ROBAXIN) IV    . ondansetron (ZOFRAN) IV    . piperacillin-tazobactam (ZOSYN)  IV 3.375 g (07/01/19 0812)     LOS: 2 days    Time spent: 25 minutes with over 50% of the time coordinating the patient's care    Harold Hedge, DO [facility] Pager [phone]  If 7PM-7AM, please contact night-coverage [email or web address] Password [email or web address] 07/01/2019, 1:36 PM

## 2019-07-01 NOTE — Progress Notes (Signed)
Progress Note: General Surgery Service   Chief Complaint/Subjective: Pain improved, some appetite this morning, +BM yesterday, ambulating well  Objective: Vital signs in last 24 hours: Temp:  [98 F (36.7 C)-98.6 F (37 C)] 98.2 F (36.8 C) (12/12 0333) Pulse Rate:  [94-115] 106 (12/12 0333) Resp:  [16-20] 17 (12/12 0333) BP: (112-133)/(64-81) 121/78 (12/12 0333) SpO2:  [98 %-100 %] 98 % (12/12 0333) Weight:  [83.3 kg] 83.3 kg (12/12 0500) Last BM Date: 06/29/19  Intake/Output from previous day: 12/11 0701 - 12/12 0700 In: 9326 [P.O.:1062; IV Piggyback:200] Out: 0  Intake/Output this shift: No intake/output data recorded.  Gen: NAD  Resp: nonlabored  Card: tachycardic  Abd: incisions bandaged, no erythema, minimal pain on palpation  Lab Results: CBC  Recent Labs    06/30/19 1101 07/01/19 0548  WBC 31.3* 18.5*  HGB 8.1* 7.6*  HCT 29.0* 27.7*  PLT 440* 426*   BMET Recent Labs    06/29/19 1149  NA 136  K 3.4*  CL 102  CO2 21*  GLUCOSE 232*  BUN 9  CREATININE 0.52  CALCIUM 8.8*   PT/INR No results for input(s): LABPROT, INR in the last 72 hours. ABG No results for input(s): PHART, HCO3 in the last 72 hours.  Invalid input(s): PCO2, PO2  Anti-infectives: Anti-infectives (From admission, onward)   Start     Dose/Rate Route Frequency Ordered Stop   06/29/19 2359  piperacillin-tazobactam (ZOSYN) IVPB 3.375 g     3.375 g 12.5 mL/hr over 240 Minutes Intravenous Every 8 hours 06/29/19 2242 07/04/19 2359   06/29/19 2315  fluconazole (DIFLUCAN) tablet 200 mg     200 mg Oral  Once 06/29/19 2242 06/29/19 2335   06/29/19 2245  cefoTEtan (CEFOTAN) 2 g in sodium chloride 0.9 % 100 mL IVPB  Status:  Discontinued     2 g 200 mL/hr over 30 Minutes Intravenous Every 12 hours 06/29/19 2242 06/29/19 2305   06/29/19 1830  cefTRIAXone (ROCEPHIN) 2 g in sodium chloride 0.9 % 100 mL IVPB     2 g 200 mL/hr over 30 Minutes Intravenous On call to O.R. 06/29/19 1737  06/29/19 2055   06/29/19 1830  metroNIDAZOLE (FLAGYL) IVPB 500 mg     500 mg 100 mL/hr over 60 Minutes Intravenous On call to O.R. 06/29/19 1737 06/29/19 2039   06/29/19 1645  piperacillin-tazobactam (ZOSYN) IVPB 3.375 g     3.375 g 100 mL/hr over 30 Minutes Intravenous  Once 06/29/19 1641 06/29/19 1732      Medications: Scheduled Meds: . acetaminophen  1,000 mg Oral Q6H  . bupivacaine liposome  20 mL Infiltration Once  . Chlorhexidine Gluconate Cloth  6 each Topical Once  . enoxaparin (LOVENOX) injection  40 mg Subcutaneous Daily  . ferrous gluconate  324 mg Oral BID WC  . folic acid  1 mg Oral Daily  . gabapentin  200 mg Oral TID  . insulin pump   Subcutaneous TID WC, HS, 0200  . levothyroxine  75 mcg Oral QAC breakfast  . lip balm  1 application Topical BID  . polyethylene glycol  17 g Oral Daily   Continuous Infusions: . lactated ringers    . methocarbamol (ROBAXIN) IV    . ondansetron (ZOFRAN) IV    . piperacillin-tazobactam (ZOSYN)  IV 3.375 g (07/01/19 0812)   PRN Meds:.acetaminophen, acetaminophen, alum & mag hydroxide-simeth, diphenhydrAMINE **OR** diphenhydrAMINE, guaiFENesin-dextromethorphan, hydrocortisone, hydrocortisone cream, HYDROmorphone (DILAUDID) injection, lactated ringers, lidocaine-prilocaine, magic mouthwash, menthol-cetylpyridinium, methocarbamol (ROBAXIN) IV, metoprolol tartrate, ondansetron (ZOFRAN)  IV **OR** ondansetron (ZOFRAN) IV, phenol, prochlorperazine, traMADol  Assessment/Plan: s/p Procedure(s): APPENDECTOMY LAPAROSCOPIC 06/29/2019 -advance diet -continue antibiotics -recheck labs in am    LOS: 2 days   Rodman Pickle, MD [phone] [facility]

## 2019-07-02 DIAGNOSIS — D509 Iron deficiency anemia, unspecified: Secondary | ICD-10-CM

## 2019-07-02 LAB — CBC
HCT: 30.6 % — ABNORMAL LOW (ref 36.0–46.0)
Hemoglobin: 8.3 g/dL — ABNORMAL LOW (ref 12.0–15.0)
MCH: 18.4 pg — ABNORMAL LOW (ref 26.0–34.0)
MCHC: 27.1 g/dL — ABNORMAL LOW (ref 30.0–36.0)
MCV: 68 fL — ABNORMAL LOW (ref 80.0–100.0)
Platelets: 489 10*3/uL — ABNORMAL HIGH (ref 150–400)
RBC: 4.5 MIL/uL (ref 3.87–5.11)
RDW: 16.7 % — ABNORMAL HIGH (ref 11.5–15.5)
WBC: 11.6 10*3/uL — ABNORMAL HIGH (ref 4.0–10.5)
nRBC: 0 % (ref 0.0–0.2)

## 2019-07-02 LAB — GLUCOSE, CAPILLARY
Glucose-Capillary: 126 mg/dL — ABNORMAL HIGH (ref 70–99)
Glucose-Capillary: 96 mg/dL (ref 70–99)
Glucose-Capillary: 136 mg/dL — ABNORMAL HIGH (ref 70–99)

## 2019-07-02 MED ORDER — AMOXICILLIN-POT CLAVULANATE 875-125 MG PO TABS: 1.0000 | ORAL_TABLET | Freq: Two times a day (BID) | ORAL | 0 refills | Status: DC

## 2019-07-02 MED ORDER — IBUPROFEN 800 MG PO TABS: 800.0000 mg | ORAL_TABLET | Freq: Three times a day (TID) | ORAL | 0 refills | Status: AC | PRN

## 2019-07-02 NOTE — Progress Notes (Signed)
Pt is being discharged home. Discharge instructions including medications and follow- ups given. Pt had no further questions at this time. 

## 2019-07-02 NOTE — Progress Notes (Signed)
This shift pt encouraged to ambulate the hall, up to chair and Incentive spirometer use. Ambulated approx 800 ft with mom before visiting hours ended. Will continue to monitor

## 2019-07-02 NOTE — Progress Notes (Signed)
PROGRESS NOTE    [place]    Code Status: Full Code  ZOX:[id number] DOB: 2001-04-15 DOA: 06/29/2019  PCP: Shirline Frees, MD    Hospital Summary  18 year old female with past medical history of type 1 diabetes on insulin pump follows with endocrinology, hypothyroidism presented to the ED for evaluation of right lower quadrant abdominal pain found to have acute appendicitis on CT scan and started on IV Zosyn.  General surgery is the primary team and performed laparoscopic appendectomy for acute perforated appendicitis with focal gangrene and diffuse peritonitis on 12/10 with Dr. Johney Maine.  [facility] consulted on 12/10 for glucose management.  A & P   Principal Problem:   Acute appendicitis with perforation and localized peritonitis Active Problems:   Type 1 diabetes mellitus on insulin therapy (HCC)   Hypothyroidism, acquired, autoimmune   Hyperglycemia   Insulin pump in place   Tachycardia   Acute gangrenous appendicitis with perforation and peritonitis   Acute appendicitis with perforation and focal gangrene with diffuse peritonitis status post laparoscopic appendectomy on 12/10 with Dr. Johney Maine, POD 3 afebrile hemodynamically stable on room air.  Being discharged home  Microcytic anemia secondary to iron deficiency anemia  Improved.  Significant iron deficiency would not typically be expected in a patient of this age unless heavy menstrual cycles or other bleeding, which the patient is not having.  Also has borderline folic acid deficiency and borderline low Vitamin B12 deficiency (267).  This suggests she may have an underlying malabsorptive issue.  Consider celiac disease or IBD in a young female patient with multiple other autoimmune diseases.  Father reports that his other children also have autoimmune diseases as well as mother. -I will follow up on tTG, MMA and homocysteine and call the patient if abnormal.  This was discussed with the patient and her father at  bedside -Follow-up with PCP -consider screening for other vitamin deficiencies and further workup outpatient -continue iron therapy  Type 1 diabetes on insulin pump -Follow-up with endocrinology outpatient  Hypothyroidism Stable -Continue Synthroid  Sinus Tachycardia Stable and improved  Subjective   Patient and father at bedside.  Patient on room air.  She recently related well.  Father states that some of his other children also have autoimmune diseases including alopecia areata and hypothyroidism patient's mother also suffers from autoimmune disease.  Patient denies any celiac symptoms such as herpetiform rash, abdominal bloating with, diarrhea or constipation.   Objective   Vitals:   07/01/19 1353 07/01/19 2106 07/02/19 0500 07/02/19 0511  BP: 123/71 128/81  117/72  Pulse: (!) 107 (!) 107  (!) 107  Resp: 18 17  18   Temp: 98.3 F (36.8 C) 97.9 F (36.6 C)  98.1 F (36.7 C)  TempSrc: Oral Oral  Oral  SpO2: 100% 100%  95%  Weight:   77 kg   Height:        Intake/Output Summary (Last 24 hours) at 07/02/2019 1304 Last data filed at 07/01/2019 1500 Gross per 24 hour  Intake 50 ml  Output -  Net 50 ml   Filed Weights   06/30/19 0500 07/01/19 0500 07/02/19 0500  Weight: 81.3 kg 83.3 kg 77 kg    Examination:  Physical Exam Vitals and nursing note reviewed.  Constitutional:      Appearance: Normal appearance.  HENT:     Head: Normocephalic and atraumatic.     Nose: Nose normal.     Mouth/Throat:     Mouth: Mucous membranes are moist.  Eyes:  Extraocular Movements: Extraocular movements intact.  Cardiovascular:     Rate and Rhythm: Normal rate and regular rhythm.  Pulmonary:     Effort: Pulmonary effort is normal.     Breath sounds: Normal breath sounds.  Abdominal:     General: Abdomen is flat.     Palpations: Abdomen is soft.  Musculoskeletal:        General: No swelling. Normal range of motion.     Cervical back: Normal range of motion. No  rigidity.  Neurological:     General: No focal deficit present.     Mental Status: She is alert. Mental status is at baseline.  Psychiatric:        Mood and Affect: Mood normal.        Behavior: Behavior normal.     Data Reviewed: I have personally reviewed following labs and imaging studies  CBC: Recent Labs  Lab 06/29/19 1149 06/30/19 0552 06/30/19 1101 07/01/19 0548 07/02/19 0637  WBC 13.7* 31.7* 31.3* 18.5* 11.6*  HGB 9.0* 8.8* 8.1* 7.6* 8.3*  HCT 31.9* 31.9* 29.0* 27.7* 30.6*  MCV 65.6* 68.2* 67.8* 67.4* 68.0*  PLT 444* 425* 440* 426* 489*   Basic Metabolic Panel: Recent Labs  Lab 06/29/19 1149 06/30/19 0552  NA 136  --   K 3.4*  --   CL 102  --   CO2 21*  --   GLUCOSE 232*  --   BUN 9  --   CREATININE 0.52  --   CALCIUM 8.8*  --   MG  --  1.7   GFR: Estimated Creatinine Clearance: 111.1 mL/min (by C-G formula based on SCr of 0.52 mg/dL). Liver Function Tests: Recent Labs  Lab 06/29/19 1149  AST 16  ALT 14  ALKPHOS 82  BILITOT 0.3  PROT 7.2  ALBUMIN 3.5   Recent Labs  Lab 06/29/19 1149  LIPASE 19   No results for input(s): AMMONIA in the last 168 hours. Coagulation Profile: No results for input(s): INR, PROTIME in the last 168 hours. Cardiac Enzymes: No results for input(s): CKTOTAL, CKMB, CKMBINDEX, TROPONINI in the last 168 hours. BNP (last 3 results) No results for input(s): PROBNP in the last 8760 hours. HbA1C: No results for input(s): HGBA1C in the last 72 hours. CBG: Recent Labs  Lab 07/01/19 1615 07/01/19 2203 07/02/19 0306 07/02/19 0744 07/02/19 1150  GLUCAP 122* 82 126* 96 136*   Lipid Profile: No results for input(s): CHOL, HDL, LDLCALC, TRIG, CHOLHDL, LDLDIRECT in the last 72 hours. Thyroid Function Tests: No results for input(s): TSH, T4TOTAL, FREET4, T3FREE, THYROIDAB in the last 72 hours. Anemia Panel: Recent Labs    06/30/19 1000 06/30/19 1101  VITAMINB12  --  267  FOLATE  --  2.4*  FERRITIN  --  20  TIBC   --  404  IRON  --  8*  RETICCTPCT 1.1  --    Sepsis Labs: No results for input(s): PROCALCITON, LATICACIDVEN in the last 168 hours.  Recent Results (from the past 240 hour(s))  Urine culture     Status: Abnormal   Collection Time: 06/29/19 12:04 PM   Specimen: Urine, Random  Result Value Ref Range Status   Specimen Description   Final    URINE, RANDOM Performed at [facility], [address], [place], [place] [address]    Special Requests   Final    NONE Performed at [facility], [address] [place], [place] [address]    Culture MULTIPLE SPECIES  PRESENT, SUGGEST RECOLLECTION (A)  Final   Report Status 06/30/2019 FINAL  Final  Respiratory Panel by RT PCR (Flu A&B, Covid) - Nasopharyngeal Swab     Status: None   Collection Time: 06/29/19  5:06 PM   Specimen: Nasopharyngeal Swab  Result Value Ref Range Status   SARS Coronavirus 2 by RT PCR NEGATIVE NEGATIVE Final    Comment: (NOTE) SARS-CoV-2 target nucleic acids are NOT DETECTED. The SARS-CoV-2 RNA is generally detectable in upper respiratoy specimens during the acute phase of infection. The lowest concentration of SARS-CoV-2 viral copies this assay can detect is 131 copies/mL. A negative result does not preclude SARS-Cov-2 infection and should not be used as the sole basis for treatment or other patient management decisions. A negative result may occur with  improper specimen collection/handling, submission of specimen other than nasopharyngeal swab, presence of viral mutation(s) within the areas targeted by this assay, and inadequate number of viral copies (<131 copies/mL). A negative result must be combined with clinical observations, patient history, and epidemiological information. The expected result is Negative. Fact Sheet for Patients:  https://www.moore.com/ Fact Sheet for Healthcare Providers:  https://www.young.biz/ This test is not  yet ap proved or cleared by the [place] FDA and  has been authorized for detection and/or diagnosis of SARS-CoV-2 by FDA under an Emergency Use Authorization (EUA). This EUA will remain  in effect (meaning this test can be used) for the duration of the COVID-19 declaration under Section 564(b)(1) of the Act, 21 U.S.C. section 360bbb-3(b)(1), unless the authorization is terminated or revoked sooner.    Influenza A by PCR NEGATIVE NEGATIVE Final   Influenza B by PCR NEGATIVE NEGATIVE Final    Comment: (NOTE) The Xpert Xpress SARS-CoV-2/FLU/RSV assay is intended as an aid in  the diagnosis of influenza from Nasopharyngeal swab specimens and  should not be used as a sole basis for treatment. Nasal washings and  aspirates are unacceptable for Xpert Xpress SARS-CoV-2/FLU/RSV  testing. Fact Sheet for Patients: https://www.moore.com/ Fact Sheet for Healthcare Providers: https://www.young.biz/ This test is not yet approved or cleared by the [place] FDA and  has been authorized for detection and/or diagnosis of SARS-CoV-2 by  FDA under an Emergency Use Authorization (EUA). This EUA will remain  in effect (meaning this test can be used) for the duration of the  Covid-19 declaration under Section 564(b)(1) of the Act, 21  U.S.C. section 360bbb-3(b)(1), unless the authorization is  terminated or revoked. Performed at [facility], [address] [place], [place] [address]          Radiology Studies: No results found.      Scheduled Meds: . acetaminophen  1,000 mg Oral Q6H  . bupivacaine liposome  20 mL Infiltration Once  . Chlorhexidine Gluconate Cloth  6 each Topical Once  . enoxaparin (LOVENOX) injection  40 mg Subcutaneous Daily  . ferrous gluconate  324 mg Oral BID WC  . folic acid  1 mg Oral Daily  . gabapentin  200 mg Oral TID  . insulin pump   Subcutaneous TID WC, HS, 0200  . levothyroxine  75 mcg Oral  QAC breakfast  . lip balm  1 application Topical BID  . polyethylene glycol  17 g Oral Daily   Continuous Infusions: . methocarbamol (ROBAXIN) IV    . ondansetron (ZOFRAN) IV    . piperacillin-tazobactam (ZOSYN)  IV 3.375 g (07/02/19 0811)     LOS: 3 days    Time spent: 15 minutes with over  50% of the time coordinating the patient's care    Jae DireJared E Anes Rigel, DO [facility] Pager [phone]  If 7PM-7AM, please contact night-coverage [email or web address] Password [email or web address] 07/02/2019, 1:04 PM

## 2019-07-02 NOTE — Discharge Summary (Signed)
Physician Discharge Summary  Elizabeth Stephenson TIW:[id number] DOB: 04-30-2001 DOA: 06/29/2019  PCP: Shirline Frees, MD  Admit date: 06/29/2019 Discharge date: 07/02/2019  Recommendations for Outpatient Follow-up:  1.  (include homehealth, outpatient follow-up instructions, specific recommendations for PCP to follow-up on, etc.)  [facility]up [facility] Follow up in 3 week(s).   Specialty: General Surgery Contact information: [address] [place] [phone]         Discharge Diagnoses:  Principal Problem:   Acute appendicitis with perforation and localized peritonitis Active Problems:   Type 1 diabetes mellitus on insulin therapy (HCC)   Hypothyroidism, acquired, autoimmune   Hyperglycemia   Insulin pump in place   Tachycardia   Acute gangrenous appendicitis with perforation and peritonitis   Surgical Procedure: lap appendectomy  Discharge Condition: Good Disposition: Home  Diet recommendation: diabetic diet   Hospital Course:  18 yo female presented to the ER with acute appendicitis. She underwent surgery and was found to have a perforated appendix. She was kept on antibiotics, had a leukocytosis up to 30 that then downtrended. She was kept on her insulin pump and medicine was consulted for assisted for DM1. She tolerated a diet and had multiple bowel movements and was discharged home.  Discharge Instructions  Discharge Instructions    Call MD for:  difficulty breathing, headache or visual disturbances   Complete by: As directed    Call MD for:  hives   Complete by: As directed    Call MD for:  persistant nausea and vomiting   Complete by: As directed    Call MD for:  redness, tenderness, or signs of infection (pain, swelling, redness, odor or green/yellow discharge around incision site)   Complete by: As directed    Call MD for:  severe uncontrolled pain   Complete by: As directed     Call MD for:  temperature >100.4   Complete by: As directed    Diet - low sodium heart healthy   Complete by: As directed    Discharge wound care:   Complete by: As directed    Ok to shower tomorrow. Glue will likely peel off in 1-3 weeks. No bandage required   Driving Restrictions   Complete by: As directed    No driving while on narcotics   Increase activity slowly   Complete by: As directed    Lifting restrictions   Complete by: As directed    No lifting greater than 20 pounds for 3 weeks     Allergies as of 07/02/2019   No Known Allergies     Medication List    TAKE these medications   Accu-Chek FastClix Lancets Misc 1 each as directed by Does not apply route. Check sugar 6 x daily   amoxicillin-clavulanate 875-125 MG tablet Commonly known as: Augmentin Take 1 tablet by mouth every 12 (twelve) hours.   Dexcom G6 Sensor Misc USE EVERY DAY AS AS NEEDED   Dexcom G6 Transmitter Misc 1 kit by Does not apply route daily as needed.   glucagon 1 MG injection Follow package directions for low blood sugar. What changed:   how much to take  how to take this  when to take this  reasons to take this  additional instructions   glucose blood test strip Commonly known as: Accu-Chek Guide Use as instructed for 6 checks per day plus per protocol for hyper/hypoglycemia   ibuprofen 800 MG tablet Commonly known  as: ADVIL Take 1 tablet (800 mg total) by mouth every 8 (eight) hours as needed.   insulin aspart 100 UNIT/ML FlexPen Commonly known as: NovoLOG FlexPen Inject up to 50 units daily in case of pump failure What changed:   how much to take  how to take this  when to take this  reasons to take this   insulin aspart 100 UNIT/ML injection Commonly known as: novoLOG INJECT UP TO 300 UNITS IN INSULIN PUMP EVERY 48 HOURS What changed:   how much to take  how to take this  when to take this   Insulin Glargine 100 UNIT/ML Solostar Pen Commonly known  as: Lantus SoloStar Up to 50 Units at bedtime and per care plan What changed:   how much to take  how to take this  when to take this  reasons to take this   Insulin Pen Needle 32G X 4 MM Misc Commonly known as: Insupen Pen Needles BD Pen Needles- brand specific. Inject insulin via insulin pen 6 x daily   levothyroxine 75 MCG tablet Commonly known as: SYNTHROID TAKE 1 TABLET BY MOUTH ONCE DAILY BEFORE BREAKFAST What changed:   how much to take  how to take this  when to take this  additional instructions   lidocaine-prilocaine cream Commonly known as: EMLA Apply 1 application topically as needed.            Discharge Care Instructions  (From admission, onward)         Start     Ordered   07/02/19 0000  Discharge wound care:    Comments: Ok to shower tomorrow. Glue will likely peel off in 1-3 weeks. No bandage required   07/02/19 1007         Follow[facility]up [facility] Follow up in 3 week(s).   Specialty: General Surgery Contact information: [address] [place] [phone]           The results of significant diagnostics from this hospitalization (including imaging, microbiology, ancillary and laboratory) are listed below for reference.    Significant Diagnostic Studies: CT Abdomen Pelvis W Contrast  Result Date: 06/29/2019 CLINICAL DATA:  Right lower quadrant pain with nausea and vomiting EXAM: CT ABDOMEN AND PELVIS WITH CONTRAST TECHNIQUE: Multidetector CT imaging of the abdomen and pelvis was performed using the standard protocol following bolus administration of intravenous contrast. CONTRAST:  184m OMNIPAQUE IOHEXOL 300 MG/ML  SOLN COMPARISON:  None. FINDINGS: Lower chest: No acute abnormality. Hepatobiliary: No focal liver abnormality is seen. No gallstones, gallbladder wall thickening, or biliary dilatation. Pancreas: Unremarkable. No pancreatic ductal dilatation  or surrounding inflammatory changes. Spleen: Normal in size without focal abnormality. Adrenals/Urinary Tract: Adrenal glands are within normal limits. Kidneys are well visualized bilaterally. No renal calculi or obstructive changes are seen. The ureters are within normal limits to the bladder. The bladder is partially distended. Stomach/Bowel: Fluid-filled loops of colon are noted without obstructive change. No small bowel obstructive changes are noted. Stomach is within normal limits. There are changes consistent with acute appendicitis. Appendix: Location: Infra cecal and medial Diameter: 16 mm Appendicolith: Absent Mucosal hyper-enhancement: Present Extraluminal gas: Small foci of extraluminal air is noted. Periappendiceal collection: Periappendiceal inflammatory changes are noted with a small amount of fluid just below the cecal tip although no demonstrable abscess is seen. Some reactive inflammatory changes of the terminal ileum are noted. Vascular/Lymphatic: No significant vascular findings are present. No enlarged abdominal  or pelvic lymph nodes. Reproductive: Uterus and bilateral adnexa are unremarkable. Other: No hernia is seen. A moderate amount of free pelvic fluid is noted. Musculoskeletal: No acute or significant osseous findings. IMPRESSION: Changes consistent with acute appendicitis with evidence of free pelvic fluid as well as a small amount of periappendiceal fluid and inflammatory change. A small focus of extraluminal air is noted as well likely related to micro perforation. Some reactive inflammatory changes in the distal ileum are seen as well. No other focal abnormality is noted. Critical Value/emergent results were called by telephone at the time of interpretation on 06/29/2019 at 4:33 pm to [facility], [place], who verbally acknowledged these results. Electronically Signed   By: Inez Catalina M.D.   On: 06/29/2019 16:34    Labs: Basic Metabolic Panel: Recent Labs  Lab 06/29/19 1149  06/30/19 0552  NA 136  --   K 3.4*  --   CL 102  --   CO2 21*  --   GLUCOSE 232*  --   BUN 9  --   CREATININE 0.52  --   CALCIUM 8.8*  --   MG  --  1.7   Liver Function Tests: Recent Labs  Lab 06/29/19 1149  AST 16  ALT 14  ALKPHOS 82  BILITOT 0.3  PROT 7.2  ALBUMIN 3.5    CBC: Recent Labs  Lab 06/29/19 1149 06/30/19 0552 06/30/19 1101 07/01/19 0548  WBC 13.7* 31.7* 31.3* 18.5*  HGB 9.0* 8.8* 8.1* 7.6*  HCT 31.9* 31.9* 29.0* 27.7*  MCV 65.6* 68.2* 67.8* 67.4*  PLT 444* 425* 440* 426*    CBG: Recent Labs  Lab 07/01/19 1158 07/01/19 1615 07/01/19 2203 07/02/19 0306 07/02/19 0744  GLUCAP 161* 122* 82 126* 96    Principal Problem:   Acute appendicitis with perforation and localized peritonitis Active Problems:   Type 1 diabetes mellitus on insulin therapy (HCC)   Hypothyroidism, acquired, autoimmune   Hyperglycemia   Insulin pump in place   Tachycardia   Acute gangrenous appendicitis with perforation and peritonitis   Time coordinating discharge: 15 min

## 2019-07-03 LAB — GLIA (IGA/G) + TTG IGA
Antigliadin Abs, IgA: 4 units (ref 0–19)
Gliadin IgG: 3 units (ref 0–19)
Tissue Transglutaminase Ab, IgA: <2 U/mL (ref 0–3)

## 2019-07-03 LAB — SURGICAL PATHOLOGY

## 2019-07-03 LAB — HOMOCYSTEINE: Homocysteine: 14.2 umol/L (ref 0.0–14.5)

## 2019-07-03 NOTE — Anesthesia Postprocedure Evaluation (Signed)
Anesthesia Post Note  Patient: Elizabeth Stephenson  Procedure(s) Performed: APPENDECTOMY LAPAROSCOPIC (N/A )     Patient location during evaluation: PACU Anesthesia Type: General Level of consciousness: awake and alert Pain management: pain level controlled Vital Signs Assessment: post-procedure vital signs reviewed and stable Respiratory status: spontaneous breathing, nonlabored ventilation, respiratory function stable and patient connected to nasal cannula oxygen Cardiovascular status: blood pressure returned to baseline and stable Postop Assessment: no apparent nausea or vomiting Anesthetic complications: no    Last Vitals:  Vitals:   07/01/19 2106 07/02/19 0511  BP: 128/81 117/72  Pulse: (!) 107 (!) 107  Resp: 17 18  Temp: 36.6 C 36.7 C  SpO2: 100% 95%    Last Pain:  Vitals:   07/02/19 0813  TempSrc:   PainSc: 0-No pain                 Dashaun Onstott L Jamilette Suchocki

## 2019-07-05 DIAGNOSIS — E109 Type 1 diabetes mellitus without complications: Secondary | ICD-10-CM | POA: Diagnosis not present

## 2019-07-05 DIAGNOSIS — Z794 Long term (current) use of insulin: Secondary | ICD-10-CM | POA: Diagnosis not present

## 2019-07-06 LAB — METHYLMALONIC ACID, SERUM: Methylmalonic Acid, Quantitative: 62 nmol/L (ref 0–378)

## 2019-07-23 ENCOUNTER — Encounter (INDEPENDENT_AMBULATORY_CARE_PROVIDER_SITE_OTHER): Payer: Self-pay

## 2019-07-23 DIAGNOSIS — E1065 Type 1 diabetes mellitus with hyperglycemia: Secondary | ICD-10-CM

## 2019-07-23 DIAGNOSIS — IMO0002 Reserved for concepts with insufficient information to code with codable children: Secondary | ICD-10-CM

## 2019-07-24 DIAGNOSIS — Z635 Disruption of family by separation and divorce: Secondary | ICD-10-CM | POA: Diagnosis not present

## 2019-07-24 DIAGNOSIS — F4323 Adjustment disorder with mixed anxiety and depressed mood: Secondary | ICD-10-CM | POA: Diagnosis not present

## 2019-07-24 MED ORDER — DEXCOM G6 SENSOR MISC: 1.0000 | 5 refills | Status: DC

## 2019-08-07 DIAGNOSIS — F4323 Adjustment disorder with mixed anxiety and depressed mood: Secondary | ICD-10-CM | POA: Diagnosis not present

## 2019-08-07 DIAGNOSIS — Z635 Disruption of family by separation and divorce: Secondary | ICD-10-CM | POA: Diagnosis not present

## 2019-08-17 DIAGNOSIS — Z635 Disruption of family by separation and divorce: Secondary | ICD-10-CM | POA: Diagnosis not present

## 2019-08-17 DIAGNOSIS — F4323 Adjustment disorder with mixed anxiety and depressed mood: Secondary | ICD-10-CM | POA: Diagnosis not present

## 2019-08-17 NOTE — Progress Notes (Signed)
Pediatric Endocrinology Diabetes Consultation Follow-up Visit  Elizabeth Stephenson Mar 20, 2001 [id number]  Chief Complaint: Follow-up type 1 diabetes   Elizabeth Frees, MD   HPI: Elizabeth Stephenson  is a 19 y.o. female presenting for follow-up of type 1 diabetes. she is accompanied to this visit by her Mother.  77. [name] was diagnosed with type 1 diabetes when she was 19 years old. She believes it was the summer of 2012. Her older brother was diagnosed with type 1 diabetes at age 7. Mom recognized weight loss and other symptoms in Elizabeth Stephenson and tested her sugar at home- it was above 300 mg/dL. She called Dr. [name] at [facility] and started her on insulin at home. She was diagnosed with hypothyroidism around the same time. She is transferring care from her PCP office to pediatric endocrinology for management of these issues.    2. Since last visit to [facility] on 04/2019, she has been well.   She was admitted to [facility] on 06/2019 due to appendicitis. She is feeling much better now. She has started using Control IQ on her insulin pump which she feels is a significant improvement. She is bolusing before most meals. She is not having very many hypoglycemic episodes but does not consistently feel them.   She is trying ot stay away from her abdomen where her scar tissue is. She tends to have site failure when she uses front of her abdomen where her scar tissue.   Taking 75 mcg of levothyroxine per day. Denies missed doses. No fatigue, constipation or cold intolerance.    Insulin regimen: Tslim insulin pump  Basal Rates 12AM 0.95  4am 1.025  6am 0.975  8am 0.975  9pm 0.95    Insulin to Carbohydrate Ratio 12AM 10  6am 9              Insulin Sensitivity Factor 12AM 30                Target Blood Glucose 12AM 150  6am 120  9pm 150           Hypoglycemia: Not feeling lows until under 60 now. Does not feel lows at night. No glucagon needed.  Insulin pump and CGM download   Med-alert ID:  Not currently wearing. Injection sites: Abdomen (some scar tissue) and legs  Annual labs due: 05/2019 Ophthalmology due: 2019--> discussed importance today.     3. ROS: Greater than 10 systems reviewed with pertinent positives listed in HPI, otherwise neg. Constitutional: Sleeping well. Weight stable.   Eyes: No changes in vision. No blurry vision. Wears glasses. Due for eye exam and discussed today.  Ears/Nose/Mouth/Throat: No difficulty swallowing. No neck pain  Cardiovascular: No palpitations.  Respiratory: No increased work of breathing. No SOB  Gastrointestinal: No constipation or diarrhea. No abdominal pain Genitourinary: No nocturia, no polyuria Musculoskeletal: No joint pain Neurologic: Normal sensation, no tremor Endocrine: No polydipsia.  No hyperpigmentation Psychiatric: Normal affect. No depression or anxiety.   Past Medical History:   Past Medical History:  Diagnosis Date  . Thyroid disease   . Type 1 diabetes mellitus on insulin therapy (Elizabeth Stephenson) 05/26/2017    Medications:  Outpatient Encounter Medications as of 08/18/2019  Medication Sig Note  . ACCU-CHEK FASTCLIX LANCETS MISC 1 each as directed by Does not apply route. Check sugar 6 x daily   . amoxicillin-clavulanate (AUGMENTIN) 875-125 MG tablet Take 1 tablet by mouth every 12 (twelve) hours.   . Continuous Blood Gluc Sensor (DEXCOM G6 SENSOR) MISC Inject 1  Product into the skin as directed.   . Continuous Blood Gluc Transmit (DEXCOM G6 TRANSMITTER) MISC 1 kit by Does not apply route daily as needed.   Marland Kitchen glucagon 1 MG injection Follow package directions for low blood sugar. (Patient taking differently: Inject 1 mg into the muscle once as needed (low blood sugar). Follow package directions for low blood sugar)   . glucose blood (ACCU-CHEK GUIDE) test strip Use as instructed for 6 checks per day plus per protocol for hyper/hypoglycemia   . ibuprofen (ADVIL) 800 MG tablet Take 1 tablet (800 mg total) by mouth every 8  (eight) hours as needed.   . insulin aspart (NOVOLOG FLEXPEN) 100 UNIT/ML FlexPen Inject up to 50 units daily in case of pump failure (Patient taking differently: Inject 0-50 Units into the skin as needed for high blood sugar (pump failure). Inject up to 50 units daily in case of pump failure) 06/29/2019: Emergency use  . insulin aspart (NOVOLOG) 100 UNIT/ML injection INJECT UP TO 300 UNITS IN INSULIN PUMP EVERY 48 HOURS (Patient taking differently: Inject 0-300 Units into the skin continuous. INJECT UP TO 300 UNITS IN INSULIN PUMP EVERY 48 HOURS)   . Insulin Glargine (LANTUS SOLOSTAR) 100 UNIT/ML Solostar Pen Up to 50 Units at bedtime and per care plan (Patient taking differently: Inject 0-50 Units into the skin at bedtime as needed (high blood sugar). Up to 50 Units at bedtime and per care plan)   . Insulin Pen Needle (INSUPEN PEN NEEDLES) 32G X 4 MM MISC BD Pen Needles- brand specific. Inject insulin via insulin pen 6 x daily   . levothyroxine (SYNTHROID) 75 MCG tablet TAKE 1 TABLET BY MOUTH ONCE DAILY BEFORE BREAKFAST (Patient taking differently: Take 75 mcg by mouth daily before breakfast. )   . lidocaine-prilocaine (EMLA) cream Apply 1 application topically as needed. (Patient not taking: Reported on 06/29/2019)    No facility-administered encounter medications on file as of 08/18/2019.    Allergies: No Known Allergies  Surgical History: Past Surgical History:  Procedure Laterality Date  . LAPAROSCOPIC APPENDECTOMY N/A 06/29/2019   Procedure: APPENDECTOMY LAPAROSCOPIC;  Surgeon: Michael Boston, MD;  Location: [facility];  Service: General;  Laterality: N/A;    Family History:  Family History  Problem Relation Age of Onset  . Hypertension Father   52 year old brother has Type 1 Diabetes.    Social History: Lives with: Mother and siblings. Stays with dad on weekends.  Looking for jobs. Wants to go to Cosmetology school   Physical Exam:  Vitals:   08/18/19 1107  BP: 122/76  Pulse: (!)  112  Weight: 170 lb 12.8 oz (77.5 kg)  Height: 5' 2.99" (1.6 m)   BP 122/76   Pulse (!) 112   Ht 5' 2.99" (1.6 m)   Wt 170 lb 12.8 oz (77.5 kg)   LMP 07/18/2019   BMI 30.26 kg/m  Body mass index: body mass index is 30.26 kg/m. Blood pressure percentiles are not available for patients who are 18 years or older.  Ht Readings from Last 3 Encounters:  08/18/19 5' 2.99" (1.6 m) (31 %, Z= -0.51)*  06/29/19 _0  (1.6 m) (31 %, Z= -0.50)*  02/14/19 5' 3.15" (1.604 m) (33 %, Z= -0.44)*   * Growth percentiles are based on CDC (Girls, 2-20 Years) data.   Wt Readings from Last 3 Encounters:  08/18/19 170 lb 12.8 oz (77.5 kg) (92 %, Z= 1.42)*  07/02/19 169 lb 12.1 oz (77 kg) (92 %, Z=  1.40)*  05/17/19 170 lb 3.2 oz (77.2 kg) (92 %, Z= 1.42)*   * Growth percentiles are based on CDC (Girls, 2-20 Years) data.    Physical exam  General: Well developed, well nourished female in no acute distress.  Alert and oriented.  Head: Normocephalic, atraumatic.   Eyes:  Pupils equal and round. EOMI.   Sclera white.  No eye drainage.   Ears/Nose/Mouth/Throat: Nares patent, no nasal drainage.  Normal dentition, mucous membranes moist.   Neck: supple, no cervical lymphadenopathy, no thyromegaly Cardiovascular: regular rate, normal S1/S2, no murmurs Respiratory: No increased work of breathing.  Lungs clear to auscultation bilaterally.  No wheezes. Abdomen: soft, nontender, nondistended. Normal bowel sounds.  No appreciable masses  Extremities: warm, well perfused, cap refill < 2 sec.   Musculoskeletal: Normal muscle mass.  Normal strength Skin: warm, dry.  No rash or lesions. Neurologic: alert and oriented, normal speech, no tremor  Labs: Last hemoglobin A1c: 8.0% on 05/2019  Results for orders placed or performed in visit on 08/18/19  POCT Glucose (Device for Home Use)  Result Value Ref Range   Glucose Fasting, POC 233 (A) 70 - 99 mg/dL   POC Glucose    POCT glycosylated hemoglobin (Hb A1C)    Result Value Ref Range   Hemoglobin A1C 6.7 (A) 4.0 - 5.6 %   HbA1c POC (<> result, manual entry)     HbA1c, POC (prediabetic range)     HbA1c, POC (controlled diabetic range)        Assessment/Plan: Malvika is a 19 y.o. female with type 1 diabetes in improving control on Tandem insulin pump and Dexcom CGM. She has made excellent improvements with diabetes control, especially since starting Control IQ. Hemoglobin A1c is 6.7% which meets the ADA goal of <7%. Clinically euthyroid on 75 mcg of levothyroxine per day.   1-3. DM w/o complication type I, uncontrolled (HCC)/Hyperglycemia/hypoglycemia unawareness  - Reviewed insulin pump and CGM download. Discussed trends and patterns.  - Rotate pump sites to prevent scar tissue.  - bolus 15 minutes prior to eating to limit blood sugar spikes.  - Reviewed carb counting and importance of accurate carb counting.  - Discussed signs and symptoms of hypoglycemia. Always have glucose available.  - POCT glucose and hemoglobin A1c  - Reviewed growth chart.   3. Hypothyroidism, acquired, autoimmune -  75 mcg of levothyroxine  - Reviewed signs and symptoms of hypothyroidism.   4. Lipophypertrophy - Rotate pump sites every 3 days. Rotate areas to prevent further scar tissue.   5. Insulin pump titration   Basal Rates 12AM 0.95  4am 1.025  6am 0.975  8am 0.975--> 1.02   9pm 0.95--> 1. 0      Follow-up:   3 months.   >45  spent today reviewing the medical chart, counseling the patient/family, and documenting today's visit.    When a patient is on insulin, intensive monitoring of blood glucose levels is necessary to avoid hyperglycemia and hypoglycemia. Severe hyperglycemia/hypoglycemia can lead to hospital admissions and be life threatening.     Hermenia Bers,  FNP-C  Pediatric Specialist  [address] [place]  [place], [address]  Tele: [phone]

## 2019-08-18 ENCOUNTER — Other Ambulatory Visit: Payer: Self-pay

## 2019-08-18 ENCOUNTER — Ambulatory Visit (INDEPENDENT_AMBULATORY_CARE_PROVIDER_SITE_OTHER): Payer: BC Managed Care – PPO | Admitting: Family

## 2019-08-18 ENCOUNTER — Encounter (INDEPENDENT_AMBULATORY_CARE_PROVIDER_SITE_OTHER): Payer: Self-pay | Admitting: Family

## 2019-08-18 VITALS — BP 122/76 | HR 112 | Ht 62.99 in | Wt 170.8 lb

## 2019-08-18 DIAGNOSIS — E109 Type 1 diabetes mellitus without complications: Secondary | ICD-10-CM

## 2019-08-18 DIAGNOSIS — E063 Autoimmune thyroiditis: Secondary | ICD-10-CM

## 2019-08-18 DIAGNOSIS — E108 Type 1 diabetes mellitus with unspecified complications: Secondary | ICD-10-CM

## 2019-08-18 DIAGNOSIS — E65 Localized adiposity: Secondary | ICD-10-CM | POA: Diagnosis not present

## 2019-08-18 DIAGNOSIS — Z9641 Presence of insulin pump (external) (internal): Secondary | ICD-10-CM | POA: Diagnosis not present

## 2019-08-18 DIAGNOSIS — E10649 Type 1 diabetes mellitus with hypoglycemia without coma: Secondary | ICD-10-CM

## 2019-08-18 DIAGNOSIS — E1065 Type 1 diabetes mellitus with hyperglycemia: Secondary | ICD-10-CM | POA: Diagnosis not present

## 2019-08-18 DIAGNOSIS — IMO0002 Reserved for concepts with insufficient information to code with codable children: Secondary | ICD-10-CM

## 2019-08-18 LAB — POCT GLYCOSYLATED HEMOGLOBIN (HGB A1C): Hemoglobin A1C: 6.7 % — AB (ref 4.0–5.6)

## 2019-08-18 LAB — POCT GLUCOSE (DEVICE FOR HOME USE): Glucose Fasting, POC: 233 mg/dL — AB (ref 70–99)

## 2019-08-18 NOTE — Patient Instructions (Signed)
-  Always have fast sugar with you in case of low blood sugar (glucose tabs, regular juice or soda, candy) -Always wear your ID that states you have diabetes -Always bring your meter to your visit -Call/Email if you want to review blood sugars   

## 2019-08-19 LAB — LIPID PANEL
Cholesterol: 154 mg/dL (ref <170)
HDL: 41 mg/dL — ABNORMAL LOW (ref >45)
LDL Cholesterol (Calc): 98 mg/dL (calc) (ref <110)
Non-HDL Cholesterol (Calc): 113 mg/dL (calc) (ref <120)
Total CHOL/HDL Ratio: 3.8 (calc) (ref <5.0)
Triglycerides: 67 mg/dL (ref <90)

## 2019-08-19 LAB — TSH: TSH: 2.67 mIU/L

## 2019-08-19 LAB — MICROALBUMIN / CREATININE URINE RATIO
Creatinine, Urine: 87 mg/dL (ref 20–275)
Microalb Creat Ratio: 98 mcg/mg creat — ABNORMAL HIGH (ref <30)
Microalb, Ur: 8.5 mg/dL

## 2019-08-19 LAB — T4, FREE: Free T4: 0.9 ng/dL (ref 0.8–1.4)

## 2019-08-21 DIAGNOSIS — Z635 Disruption of family by separation and divorce: Secondary | ICD-10-CM | POA: Diagnosis not present

## 2019-08-21 DIAGNOSIS — F4323 Adjustment disorder with mixed anxiety and depressed mood: Secondary | ICD-10-CM | POA: Diagnosis not present

## 2019-08-28 DIAGNOSIS — Z635 Disruption of family by separation and divorce: Secondary | ICD-10-CM | POA: Diagnosis not present

## 2019-08-28 DIAGNOSIS — F4323 Adjustment disorder with mixed anxiety and depressed mood: Secondary | ICD-10-CM | POA: Diagnosis not present

## 2019-09-02 ENCOUNTER — Other Ambulatory Visit (INDEPENDENT_AMBULATORY_CARE_PROVIDER_SITE_OTHER): Payer: Self-pay | Admitting: Family

## 2019-09-20 ENCOUNTER — Other Ambulatory Visit (INDEPENDENT_AMBULATORY_CARE_PROVIDER_SITE_OTHER): Payer: Self-pay | Admitting: Family

## 2019-09-20 DIAGNOSIS — E1065 Type 1 diabetes mellitus with hyperglycemia: Secondary | ICD-10-CM

## 2019-09-20 DIAGNOSIS — IMO0002 Reserved for concepts with insufficient information to code with codable children: Secondary | ICD-10-CM

## 2019-09-26 ENCOUNTER — Encounter (INDEPENDENT_AMBULATORY_CARE_PROVIDER_SITE_OTHER): Payer: Self-pay

## 2019-10-04 MED ORDER — DEXCOM G6 TRANSMITTER MISC: 1.0000 [IU] | 1 refills | Status: DC

## 2019-11-01 ENCOUNTER — Other Ambulatory Visit (INDEPENDENT_AMBULATORY_CARE_PROVIDER_SITE_OTHER): Payer: Self-pay | Admitting: Family

## 2019-11-01 DIAGNOSIS — E109 Type 1 diabetes mellitus without complications: Secondary | ICD-10-CM

## 2019-11-16 ENCOUNTER — Encounter (INDEPENDENT_AMBULATORY_CARE_PROVIDER_SITE_OTHER): Payer: Self-pay | Admitting: Family

## 2019-11-16 ENCOUNTER — Other Ambulatory Visit: Payer: Self-pay

## 2019-11-16 ENCOUNTER — Ambulatory Visit (INDEPENDENT_AMBULATORY_CARE_PROVIDER_SITE_OTHER): Payer: BC Managed Care – PPO | Admitting: Family

## 2019-11-16 VITALS — BP 118/74 | HR 120 | Ht 63.23 in | Wt 174.6 lb

## 2019-11-16 DIAGNOSIS — E65 Localized adiposity: Secondary | ICD-10-CM | POA: Diagnosis not present

## 2019-11-16 DIAGNOSIS — R739 Hyperglycemia, unspecified: Secondary | ICD-10-CM

## 2019-11-16 DIAGNOSIS — E108 Type 1 diabetes mellitus with unspecified complications: Secondary | ICD-10-CM

## 2019-11-16 DIAGNOSIS — IMO0002 Reserved for concepts with insufficient information to code with codable children: Secondary | ICD-10-CM

## 2019-11-16 DIAGNOSIS — Z9641 Presence of insulin pump (external) (internal): Secondary | ICD-10-CM

## 2019-11-16 DIAGNOSIS — E063 Autoimmune thyroiditis: Secondary | ICD-10-CM

## 2019-11-16 DIAGNOSIS — E1065 Type 1 diabetes mellitus with hyperglycemia: Secondary | ICD-10-CM | POA: Diagnosis not present

## 2019-11-16 LAB — POCT GLYCOSYLATED HEMOGLOBIN (HGB A1C): Hemoglobin A1C: 7.1 % — AB (ref 4.0–5.6)

## 2019-11-16 LAB — POCT GLUCOSE (DEVICE FOR HOME USE): Glucose Fasting, POC: 179 mg/dL — AB (ref 70–99)

## 2019-11-16 NOTE — Patient Instructions (Signed)
-  Always have fast sugar with you in case of low blood sugar (glucose tabs, regular juice or soda, candy) -Always wear your ID that states you have diabetes -Always bring your meter to your visit -Call/Email if you want to review blood sugars   

## 2019-11-16 NOTE — Progress Notes (Signed)
Pediatric Endocrinology Diabetes Consultation Follow-up Visit  AYRIANNA MCGINNISS April 22, 2001 [id number]  Chief Complaint: Follow-up type 1 diabetes   [name], MD   HPI: Katerin  is a 19 y.o. female presenting for follow-up of type 1 diabetes. she is accompanied to this visit by her Mother.  1. Artemisa was diagnosed with type 1 diabetes when she was 19 years old. She believes it was the summer of 2012. Her older brother was diagnosed with type 1 diabetes at age 39. Mom recognized weight loss and other symptoms in Jessieca and tested her sugar at home- it was above 300 mg/dL. She called Dr. Tiburcio Pea at [facility] and started her on insulin at home. She was diagnosed with hypothyroidism around the same time. She is transferring care from her PCP office to pediatric endocrinology for management of these issues.    2. Since last visit to [facility] on 07/2019, she has been well.   Wearing Tslim insulin pump with Dexcom and control IQ. She reports blood sugars have been overall steady. She had one low in the middle of the night but states it was because she was running high before bed and gave "extra" insulin. She is bolusing before eating most of the time. Rotating pump sites about every 3 days.   She is reducing her carb intake and has been drinking more water. She feels like this has helped stabilize blood sugars.   Taking 75 mcg of levothyroxine per day. Denies missed doses. No fatigue, constipation or cold intolerance.    Insulin regimen: Tslim insulin pump  Basal Rates 12AM 0.95  4am 1.025  6am 0.975  8am 1.02  9pm 1.0    Insulin to Carbohydrate Ratio 12AM 10  6am 9              Insulin Sensitivity Factor 12AM 30                Target Blood Glucose 12AM 150  6am 120  9pm 150           Hypoglycemia: Not feeling lows until under 60 now. Does not feel lows at night. No glucagon needed.  Insulin pump and CGM download   Med-alert ID: Not currently  wearing. Injection sites: Abdomen (some scar tissue) and legs  Annual labs due: 07/2020 Ophthalmology due: 09/2020    3. ROS: Greater than 10 systems reviewed with pertinent positives listed in HPI, otherwise neg. Constitutional: Sleeping well. Weight stable.   Eyes: No changes in vision. No blurry vision. Wears glasses.  Ears/Nose/Mouth/Throat: No difficulty swallowing. No neck pain  Cardiovascular: No palpitations.  Respiratory: No increased work of breathing. No SOB  Gastrointestinal: No constipation or diarrhea. No abdominal pain Genitourinary: No nocturia, no polyuria Musculoskeletal: No joint pain Neurologic: Normal sensation, no tremor Endocrine: No polydipsia.  No hyperpigmentation Psychiatric: Normal affect. No depression or anxiety.   Past Medical History:   Past Medical History:  Diagnosis Date  . Thyroid disease   . Type 1 diabetes mellitus on insulin therapy ([facility]) 05/26/2017    Medications:  Outpatient Encounter Medications as of 11/16/2019  Medication Sig Note  . ACCU-CHEK FASTCLIX LANCETS MISC 1 each as directed by Does not apply route. Check sugar 6 x daily   . amoxicillin-clavulanate (AUGMENTIN) 875-125 MG tablet Take 1 tablet by mouth every 12 (twelve) hours.   . Continuous Blood Gluc Sensor (DEXCOM G6 SENSOR) MISC USE EVERY DAY AS AS NEEDED   . Continuous Blood Gluc Transmit (DEXCOM G6 TRANSMITTER) MISC 1  Units by Other route See admin instructions.   Marland Kitchen glucagon 1 MG injection Follow package directions for low blood sugar. (Patient taking differently: Inject 1 mg into the muscle once as needed (low blood sugar). Follow package directions for low blood sugar)   . glucose blood (ACCU-CHEK GUIDE) test strip Use as instructed for 6 checks per day plus per protocol for hyper/hypoglycemia   . ibuprofen (ADVIL) 800 MG tablet Take 1 tablet (800 mg total) by mouth every 8 (eight) hours as needed.   . insulin aspart (NOVOLOG FLEXPEN) 100 UNIT/ML FlexPen Inject up to 50  units daily in case of pump failure (Patient taking differently: Inject 0-50 Units into the skin as needed for high blood sugar (pump failure). Inject up to 50 units daily in case of pump failure) 06/29/2019: Emergency use  . insulin aspart (NOVOLOG) 100 UNIT/ML injection INJECT UP TO 300 UNITS VIA INSULIN PUMP EVERY 48 HOURS   . Insulin Glargine (LANTUS SOLOSTAR) 100 UNIT/ML Solostar Pen Up to 50 Units at bedtime and per care plan (Patient taking differently: Inject 0-50 Units into the skin at bedtime as needed (high blood sugar). Up to 50 Units at bedtime and per care plan)   . Insulin Pen Needle (INSUPEN PEN NEEDLES) 32G X 4 MM MISC BD Pen Needles- brand specific. Inject insulin via insulin pen 6 x daily   . levothyroxine (SYNTHROID) 75 MCG tablet TAKE 1 TABLET BY MOUTH ONCE DAILY BEFORE BREAKFAST (Patient taking differently: Take 75 mcg by mouth daily before breakfast. )   . lidocaine-prilocaine (EMLA) cream Apply 1 application topically as needed.    No facility-administered encounter medications on file as of 11/16/2019.    Allergies: No Known Allergies  Surgical History: Past Surgical History:  Procedure Laterality Date  . APPENDECTOMY    . LAPAROSCOPIC APPENDECTOMY N/A 06/29/2019   Procedure: APPENDECTOMY LAPAROSCOPIC;  Surgeon: Karie Soda, MD;  Location: [facility];  Service: General;  Laterality: N/A;    Family History:  Family History  Problem Relation Age of Onset  . Hypertension Father   15 year old brother has Type 1 Diabetes.    Social History: Lives with: Mother and siblings. Stays with dad on weekends.  Looking for jobs. Wants to go to Cosmetology school   Physical Exam:  Vitals:   11/16/19 1105  BP: 118/74  Pulse: (!) 120  Weight: 174 lb 9.6 oz (79.2 kg)  Height: 5' 3.23" (1.606 m)   BP 118/74   Pulse (!) 120   Ht 5' 3.23" (1.606 m)   Wt 174 lb 9.6 oz (79.2 kg)   BMI 30.71 kg/m  Body mass index: body mass index is 30.71 kg/m. Blood pressure percentiles  are not available for patients who are 18 years or older.  Ht Readings from Last 3 Encounters:  11/16/19 5' 3.23" (1.606 m) (34 %, Z= -0.42)*  08/18/19 5' 2.99" (1.6 m) (31 %, Z= -0.51)*  06/29/19 5\' 3"  (1.6 m) (31 %, Z= -0.50)*   * Growth percentiles are based on CDC (Girls, 2-20 Years) data.   Wt Readings from Last 3 Encounters:  11/16/19 174 lb 9.6 oz (79.2 kg) (93 %, Z= 1.49)*  08/18/19 170 lb 12.8 oz (77.5 kg) (92 %, Z= 1.42)*  07/02/19 169 lb 12.1 oz (77 kg) (92 %, Z= 1.40)*   * Growth percentiles are based on CDC (Girls, 2-20 Years) data.    Physical exam  General: Well developed, well nourished female in no acute distress.   Head: Normocephalic,  atraumatic.   Eyes:  Pupils equal and round. EOMI.   Sclera white.  No eye drainage.   Ears/Nose/Mouth/Throat: Nares patent, no nasal drainage.  Normal dentition, mucous membranes moist.   Neck: supple, no cervical lymphadenopathy, no thyromegaly Cardiovascular: regular rate, normal S1/S2, no murmurs Respiratory: No increased work of breathing.  Lungs clear to auscultation bilaterally.  No wheezes. Abdomen: soft, nontender, nondistended. Normal bowel sounds.  No appreciable masses  Extremities: warm, well perfused, cap refill < 2 sec.   Musculoskeletal: Normal muscle mass.  Normal strength Skin: warm, dry.  No rash or lesions. Neurologic: alert and oriented, normal speech, no tremor   Labs: Last hemoglobin A1c: 6.7% on 07/2019  Results for orders placed or performed in visit on 11/16/19  POCT Glucose (Device for Home Use)  Result Value Ref Range   Glucose Fasting, POC 179 (A) 70 - 99 mg/dL   POC Glucose    POCT glycosylated hemoglobin (Hb A1C)  Result Value Ref Range   Hemoglobin A1C 7.1 (A) 4.0 - 5.6 %   HbA1c POC (<> result, manual entry)     HbA1c, POC (prediabetic range)     HbA1c, POC (controlled diabetic range)        Assessment/Plan: Aurora is a 19 y.o. female with type 1 diabetes in improving control on  Tandem insulin pump and Dexcom CGM. Blood sugars are largely within target range. Doing well with insulin pump closed loop therapy. Hemoglobin A1c is 7.1% which is higher then ADA goal of <7.5% Clinically euthyroid on 75 mcg of levothyroxine per day.   1-3. DM w/o complication type I, uncontrolled (HCC)/Hyperglycemia/hypoglycemia unawareness  - Reviewed insulin pump and CGM download. Discussed trends and patterns.  - Rotate pump sites to prevent scar tissue.  - bolus 15 minutes prior to eating to limit blood sugar spikes.  - Reviewed carb counting and importance of accurate carb counting.  - Discussed signs and symptoms of hypoglycemia. Always have glucose available.  - POCT glucose and hemoglobin A1c  - Reviewed growth chart.  - Discussed exercise with diabetes. Advised insulin changes during exercise.    3. Hypothyroidism, acquired, autoimmune -  75 mcg of levothyroxine  - Reviewed signs and symptoms of hypothyroidism.   4. Lipophypertrophy - Rotate pump sites every 3 days. Rotate areas to prevent further scar tissue.   5. Insulin pump titration  - No changes today.    Follow-up:   3 months.   >45 spent today reviewing the medical chart, counseling the patient/family, and documenting today's visit.   When a patient is on insulin, intensive monitoring of blood glucose levels is necessary to avoid hyperglycemia and hypoglycemia. Severe hyperglycemia/hypoglycemia can lead to hospital admissions and be life threatening.     Hermenia Bers,  FNP-C  Pediatric Specialist  [address] [place]  [place], [address]  Tele: [phone]

## 2019-12-06 ENCOUNTER — Encounter (INDEPENDENT_AMBULATORY_CARE_PROVIDER_SITE_OTHER): Payer: Self-pay

## 2019-12-07 ENCOUNTER — Other Ambulatory Visit (INDEPENDENT_AMBULATORY_CARE_PROVIDER_SITE_OTHER): Payer: Self-pay

## 2019-12-07 DIAGNOSIS — E063 Autoimmune thyroiditis: Secondary | ICD-10-CM

## 2019-12-07 DIAGNOSIS — E109 Type 1 diabetes mellitus without complications: Secondary | ICD-10-CM

## 2019-12-07 MED ORDER — LEVOTHYROXINE SODIUM 75 MCG PO TABS: ORAL_TABLET | ORAL | 1 refills | Status: DC

## 2019-12-07 MED ORDER — INSULIN ASPART 100 UNIT/ML ~~LOC~~ SOLN: SUBCUTANEOUS | 1 refills | Status: DC

## 2019-12-08 DIAGNOSIS — E109 Type 1 diabetes mellitus without complications: Secondary | ICD-10-CM | POA: Diagnosis not present

## 2019-12-08 DIAGNOSIS — Z794 Long term (current) use of insulin: Secondary | ICD-10-CM | POA: Diagnosis not present

## 2019-12-09 ENCOUNTER — Other Ambulatory Visit (INDEPENDENT_AMBULATORY_CARE_PROVIDER_SITE_OTHER): Payer: Self-pay | Admitting: Family

## 2019-12-09 ENCOUNTER — Encounter (INDEPENDENT_AMBULATORY_CARE_PROVIDER_SITE_OTHER): Payer: Self-pay

## 2019-12-09 DIAGNOSIS — E063 Autoimmune thyroiditis: Secondary | ICD-10-CM

## 2019-12-11 ENCOUNTER — Other Ambulatory Visit (INDEPENDENT_AMBULATORY_CARE_PROVIDER_SITE_OTHER): Payer: Self-pay | Admitting: Family

## 2019-12-11 DIAGNOSIS — E109 Type 1 diabetes mellitus without complications: Secondary | ICD-10-CM

## 2019-12-11 MED ORDER — INSULIN ASPART 100 UNIT/ML ~~LOC~~ SOLN: SUBCUTANEOUS | 1 refills | Status: DC

## 2020-01-03 DIAGNOSIS — F4320 Adjustment disorder, unspecified: Secondary | ICD-10-CM | POA: Diagnosis not present

## 2020-01-17 DIAGNOSIS — F4320 Adjustment disorder, unspecified: Secondary | ICD-10-CM | POA: Diagnosis not present

## 2020-01-31 DIAGNOSIS — F4320 Adjustment disorder, unspecified: Secondary | ICD-10-CM | POA: Diagnosis not present

## 2020-02-12 ENCOUNTER — Ambulatory Visit (INDEPENDENT_AMBULATORY_CARE_PROVIDER_SITE_OTHER): Payer: BC Managed Care – PPO | Admitting: Family

## 2020-02-12 ENCOUNTER — Other Ambulatory Visit: Payer: Self-pay

## 2020-02-12 ENCOUNTER — Encounter (INDEPENDENT_AMBULATORY_CARE_PROVIDER_SITE_OTHER): Payer: Self-pay | Admitting: Family

## 2020-02-12 VITALS — BP 128/76 | HR 80 | Wt 185.6 lb

## 2020-02-12 DIAGNOSIS — E10649 Type 1 diabetes mellitus with hypoglycemia without coma: Secondary | ICD-10-CM

## 2020-02-12 DIAGNOSIS — E109 Type 1 diabetes mellitus without complications: Secondary | ICD-10-CM | POA: Diagnosis not present

## 2020-02-12 DIAGNOSIS — R739 Hyperglycemia, unspecified: Secondary | ICD-10-CM | POA: Diagnosis not present

## 2020-02-12 DIAGNOSIS — E65 Localized adiposity: Secondary | ICD-10-CM | POA: Diagnosis not present

## 2020-02-12 DIAGNOSIS — E063 Autoimmune thyroiditis: Secondary | ICD-10-CM

## 2020-02-12 DIAGNOSIS — R635 Abnormal weight gain: Secondary | ICD-10-CM

## 2020-02-12 DIAGNOSIS — Z4681 Encounter for fitting and adjustment of insulin pump: Secondary | ICD-10-CM

## 2020-02-12 LAB — POCT GLUCOSE (DEVICE FOR HOME USE): Glucose Fasting, POC: 173 mg/dL — AB (ref 70–99)

## 2020-02-12 LAB — POCT GLYCOSYLATED HEMOGLOBIN (HGB A1C): Hemoglobin A1C: 7.2 % — AB (ref 4.0–5.6)

## 2020-02-12 NOTE — Progress Notes (Signed)
Pediatric Endocrinology Diabetes Consultation Follow-up Visit  PRAKRITI CARIGNAN 2001-01-19 [id number]  Chief Complaint: Follow-up type 1 diabetes   [name], MD   HPI: Elizabeth Stephenson  is a 19 y.o. female presenting for follow-up of type 1 diabetes. she is accompanied to this visit by her Mother.  1. Johann was diagnosed with type 1 diabetes when she was 19 years old. She believes it was the summer of 2012. Her older brother was diagnosed with type 1 diabetes at age 61. Mom recognized weight loss and other symptoms in [place] and tested her sugar at home- it was above 300 mg/dL. She called Dr. Tiburcio Pea at [facility] and started her on insulin at home. She was diagnosed with hypothyroidism around the same time. She is transferring care from her PCP office to pediatric endocrinology for management of these issues.    2. Since last visit to [facility] on 10/2019, she has been well.   She got her license and is very happy to be driving. She is working at [employer] about 40 hours per week. She is using Tslim insulin pump, it is working well for her. She is bolusing 20-30 minutes before eating. She feels like her blood sugars have been " pretty good" overall, her blood sugars are better when she is more active.   Has started working out about 2-3 x per week for 1-1.5 hours.   Taking 75 mcg of levothyroxine per day. Denies missed doses. No fatigue, constipation or cold intolerance.    Insulin regimen: Tslim insulin pump  Basal Rates 12AM 0.95  4am 1.025  6am 0.975  8am 1.02  9pm 1.0    Insulin to Carbohydrate Ratio 12AM 10  6am 9              Insulin Sensitivity Factor 12AM 30                Target Blood Glucose 12AM 150  6am 120  9pm 150           Hypoglycemia: Not feeling lows until under 60 now. Does not feel lows at night. No glucagon needed.  Insulin pump and CGM download   Med-alert ID: Not currently wearing. Injection sites: Abdomen (some scar  tissue) and legs  Annual labs due: 07/2020 Ophthalmology due: 09/2020    3. ROS: Greater than 10 systems reviewed with pertinent positives listed in HPI, otherwise neg. Constitutional: Sleeping well. 11 lbs weight gain.  Eyes: No changes in vision. No blurry vision. Wears glasses.  Ears/Nose/Mouth/Throat: No difficulty swallowing. No neck pain  Cardiovascular: No palpitations.  Respiratory: No increased work of breathing. No SOB  Gastrointestinal: No constipation or diarrhea. No abdominal pain Genitourinary: No nocturia, no polyuria Musculoskeletal: No joint pain Neurologic: Normal sensation, no tremor Endocrine: No polydipsia.  No hyperpigmentation Psychiatric: Normal affect. No depression or anxiety.   Past Medical History:   Past Medical History:  Diagnosis Date   Thyroid disease    Type 1 diabetes mellitus on insulin therapy (HCC) 05/26/2017    Medications:  Outpatient Encounter Medications as of 02/12/2020  Medication Sig Note   ACCU-CHEK FASTCLIX LANCETS MISC 1 each as directed by Does not apply route. Check sugar 6 x daily    Continuous Blood Gluc Sensor (DEXCOM G6 SENSOR) MISC USE EVERY DAY AS AS NEEDED    Continuous Blood Gluc Transmit (DEXCOM G6 TRANSMITTER) MISC 1 Units by Other route See admin instructions.    glucose blood (ACCU-CHEK GUIDE) test strip Use as instructed  for 6 checks per day plus per protocol for hyper/hypoglycemia    insulin aspart (NOVOLOG) 100 UNIT/ML injection INJECT UP TO 300 UNITS VIA INSULIN PUMP EVERY 36HOURS    levothyroxine (SYNTHROID) 75 MCG tablet TAKE 1 TABLET BY MOUTH EVERY DAY BEFORE BREAKFAST    glucagon 1 MG injection Follow package directions for low blood sugar. (Patient not taking: Reported on 02/12/2020) 02/12/2020: PRN emergencies   ibuprofen (ADVIL) 800 MG tablet Take 1 tablet (800 mg total) by mouth every 8 (eight) hours as needed. (Patient not taking: Reported on 02/12/2020)    insulin aspart (NOVOLOG FLEXPEN) 100 UNIT/ML  FlexPen Inject up to 50 units daily in case of pump failure (Patient not taking: Reported on 02/12/2020) 06/29/2019: Emergency use   Insulin Glargine (LANTUS SOLOSTAR) 100 UNIT/ML Solostar Pen Up to 50 Units at bedtime and per care plan (Patient not taking: Reported on 02/12/2020) 02/12/2020: PRN pump failure   Insulin Pen Needle (INSUPEN PEN NEEDLES) 32G X 4 MM MISC BD Pen Needles- brand specific. Inject insulin via insulin pen 6 x daily (Patient not taking: Reported on 02/12/2020)    lidocaine-prilocaine (EMLA) cream Apply 1 application topically as needed. (Patient not taking: Reported on 02/12/2020)    [DISCONTINUED] amoxicillin-clavulanate (AUGMENTIN) 875-125 MG tablet Take 1 tablet by mouth every 12 (twelve) hours.    [DISCONTINUED] insulin aspart (NOVOLOG) 100 UNIT/ML injection INJECT UP TO 300 UNITS VIA INSULIN PUMP EVERY 48 HOURS    [DISCONTINUED] levothyroxine (SYNTHROID) 75 MCG tablet TAKE 1 TABLET BY MOUTH ONCE DAILY BEFORE BREAKFAST (Patient taking differently: Take 75 mcg by mouth daily before breakfast. )    No facility-administered encounter medications on file as of 02/12/2020.    Allergies: No Known Allergies  Surgical History: Past Surgical History:  Procedure Laterality Date   APPENDECTOMY     LAPAROSCOPIC APPENDECTOMY N/A 06/29/2019   Procedure: APPENDECTOMY LAPAROSCOPIC;  Surgeon: Karie Soda, MD;  Location: [facility];  Service: General;  Laterality: N/A;    Family History:  Family History  Problem Relation Age of Onset   Hypertension Father   89 year old brother has Type 1 Diabetes.    Social History: Lives with: Mother and siblings. Stays with dad on weekends.  Looking for jobs. Wants to go to Cosmetology school   Physical Exam:  Vitals:   02/12/20 1119  BP: 128/76  Pulse: 80  Weight: 185 lb 9.6 oz (84.2 kg)   BP 128/76    Pulse 80    Wt 185 lb 9.6 oz (84.2 kg)    LMP 02/11/2020    BMI 32.64 kg/m  Body mass index: body mass index is 32.64  kg/m. Blood pressure percentiles are not available for patients who are 18 years or older.  Ht Readings from Last 3 Encounters:  11/16/19 5' 3.23" (1.606 m) (34 %, Z= -0.42)*  08/18/19 5' 2.99" (1.6 m) (31 %, Z= -0.51)*  06/29/19 5\' 3"  (1.6 m) (31 %, Z= -0.50)*   * Growth percentiles are based on CDC (Girls, 2-20 Years) data.   Wt Readings from Last 3 Encounters:  02/12/20 185 lb 9.6 oz (84.2 kg) (96 %, Z= 1.70)*  11/16/19 174 lb 9.6 oz (79.2 kg) (93 %, Z= 1.49)*  08/18/19 170 lb 12.8 oz (77.5 kg) (92 %, Z= 1.42)*   * Growth percentiles are based on CDC (Girls, 2-20 Years) data.    Physical exam  General: Well developed, well nourished female in no acute distress.   Head: Normocephalic, atraumatic.   Eyes:  Pupils equal and round. EOMI.   Sclera white.  No eye drainage.   Ears/Nose/Mouth/Throat: Nares patent, no nasal drainage.  Normal dentition, mucous membranes moist.   Neck: supple, no cervical lymphadenopathy, no thyromegaly Cardiovascular: regular rate, normal S1/S2, no murmurs Respiratory: No increased work of breathing.  Lungs clear to auscultation bilaterally.  No wheezes. Abdomen: soft, nontender, nondistended. Normal bowel sounds.  No appreciable masses  Extremities: warm, well perfused, cap refill < 2 sec.   Musculoskeletal: Normal muscle mass.  Normal strength Skin: warm, dry.  No rash or lesions. Neurologic: alert and oriented, normal speech, no tremor    Labs: Last hemoglobin A1c: 7.1% on 10/2019  Results for orders placed or performed in visit on 02/12/20  POCT Glucose (Device for Home Use)  Result Value Ref Range   Glucose Fasting, POC 173 (A) 70 - 99 mg/dL   POC Glucose    POCT glycosylated hemoglobin (Hb A1C)  Result Value Ref Range   Hemoglobin A1C 7.2 (A) 4.0 - 5.6 %   HbA1c POC (<> result, manual entry)     HbA1c, POC (prediabetic range)     HbA1c, POC (controlled diabetic range)        Assessment/Plan: Vale is a 19 y.o. female with type  1 diabetes in improving control on Tandem insulin pump and Dexcom CGM. She is having a pattern of hyperglycemia between 9pm-3am. Will give stronger carb ratio and basal rate. Hemoglobin A1c is 7.2% today. She has gained 11 lbs, BMI is >95%ile. Clinically euthyroid on 75 mcg of levothyroxine per day.   1-3. DM w/o complication type I, uncontrolled (HCC)/Hyperglycemia/hypoglycemia unawareness  - Reviewed insulin pump and CGM download. Discussed trends and patterns.  - Rotate pump sites to prevent scar tissue.  - bolus 15 minutes prior to eating to limit blood sugar spikes.  - Reviewed carb counting and importance of accurate carb counting.  - Discussed signs and symptoms of hypoglycemia. Always have glucose available.  - POCT glucose and hemoglobin A1c  - Reviewed growth chart.   3. Hypothyroidism, acquired, autoimmune -  75 mcg of levothyroxine  - Reviewed signs and symptoms of hypothyroidism.  - Repeat labs at next visit.   4. Lipophypertrophy - Encouraged to frequently rotate insulin pump sites to new areas.   5. Insulin pump titration  - Basal Rates 12AM 0.95  4am 1.025  6am 0.975  8am 1.02  9pm 1.0--> 1.07    Insulin to Carbohydrate Ratio 12AM 10  6am 9   9pm 9--> 8          6. Weight gain - Discussed diet and made suggestions for improvements.  - Encouraged daily exercise  - Answered questions.   Follow-up:   3 months.   >45 spent today reviewing the medical chart, counseling the patient/family, and documenting today's visit.    When a patient is on insulin, intensive monitoring of blood glucose levels is necessary to avoid hyperglycemia and hypoglycemia. Severe hyperglycemia/hypoglycemia can lead to hospital admissions and be life threatening.     Gretchen Short,  FNP-C  Pediatric Specialist  [address]  [place], [address]  Tele: [phone]

## 2020-02-12 NOTE — Patient Instructions (Addendum)
-   Basal Rates 12AM 0.95--> 1. 0   4am 1.025  6am 0.975  8am 1.02  9pm 1.0--> 1.07    Insulin to Carbohydrate Ratio 12AM 10  6am 9   9pm 9--> 8

## 2020-02-14 DIAGNOSIS — F4320 Adjustment disorder, unspecified: Secondary | ICD-10-CM | POA: Diagnosis not present

## 2020-02-22 ENCOUNTER — Encounter (INDEPENDENT_AMBULATORY_CARE_PROVIDER_SITE_OTHER): Payer: Self-pay

## 2020-03-03 ENCOUNTER — Other Ambulatory Visit (INDEPENDENT_AMBULATORY_CARE_PROVIDER_SITE_OTHER): Payer: Self-pay | Admitting: Family

## 2020-03-13 DIAGNOSIS — F4320 Adjustment disorder, unspecified: Secondary | ICD-10-CM | POA: Diagnosis not present

## 2020-03-15 ENCOUNTER — Other Ambulatory Visit (INDEPENDENT_AMBULATORY_CARE_PROVIDER_SITE_OTHER): Payer: Self-pay | Admitting: Family

## 2020-03-15 DIAGNOSIS — E109 Type 1 diabetes mellitus without complications: Secondary | ICD-10-CM

## 2020-03-18 ENCOUNTER — Other Ambulatory Visit (INDEPENDENT_AMBULATORY_CARE_PROVIDER_SITE_OTHER): Payer: Self-pay | Admitting: Family

## 2020-03-18 DIAGNOSIS — E1065 Type 1 diabetes mellitus with hyperglycemia: Secondary | ICD-10-CM

## 2020-03-18 DIAGNOSIS — IMO0002 Reserved for concepts with insufficient information to code with codable children: Secondary | ICD-10-CM

## 2020-03-27 DIAGNOSIS — F4320 Adjustment disorder, unspecified: Secondary | ICD-10-CM | POA: Diagnosis not present

## 2020-04-02 ENCOUNTER — Telehealth (INDEPENDENT_AMBULATORY_CARE_PROVIDER_SITE_OTHER): Payer: Self-pay | Admitting: Family

## 2020-04-02 NOTE — Telephone Encounter (Signed)
DMV paperwork was dropped off at the front desk for Elizabeth Stephenson to complete. Paperwork is in his box. Call patient at [phone] when complete.

## 2020-04-03 ENCOUNTER — Other Ambulatory Visit (INDEPENDENT_AMBULATORY_CARE_PROVIDER_SITE_OTHER): Payer: Self-pay | Admitting: Family

## 2020-04-03 ENCOUNTER — Telehealth (INDEPENDENT_AMBULATORY_CARE_PROVIDER_SITE_OTHER): Payer: Self-pay

## 2020-04-03 NOTE — Telephone Encounter (Signed)
Called to let patient know that [facility] paperwork is ready.

## 2020-04-09 ENCOUNTER — Other Ambulatory Visit (INDEPENDENT_AMBULATORY_CARE_PROVIDER_SITE_OTHER): Payer: Self-pay | Admitting: Family

## 2020-04-09 DIAGNOSIS — E063 Autoimmune thyroiditis: Secondary | ICD-10-CM

## 2020-04-10 ENCOUNTER — Encounter (INDEPENDENT_AMBULATORY_CARE_PROVIDER_SITE_OTHER): Payer: Self-pay

## 2020-04-10 DIAGNOSIS — F4320 Adjustment disorder, unspecified: Secondary | ICD-10-CM | POA: Diagnosis not present

## 2020-04-24 DIAGNOSIS — F4320 Adjustment disorder, unspecified: Secondary | ICD-10-CM | POA: Diagnosis not present

## 2020-05-06 ENCOUNTER — Encounter (INDEPENDENT_AMBULATORY_CARE_PROVIDER_SITE_OTHER): Payer: Self-pay

## 2020-05-14 ENCOUNTER — Encounter (INDEPENDENT_AMBULATORY_CARE_PROVIDER_SITE_OTHER): Payer: Self-pay | Admitting: Family

## 2020-05-14 ENCOUNTER — Ambulatory Visit (INDEPENDENT_AMBULATORY_CARE_PROVIDER_SITE_OTHER): Payer: BC Managed Care – PPO | Admitting: Family

## 2020-05-14 ENCOUNTER — Other Ambulatory Visit: Payer: Self-pay

## 2020-05-14 VITALS — BP 132/82 | HR 90 | Wt 180.4 lb

## 2020-05-14 DIAGNOSIS — E10649 Type 1 diabetes mellitus with hypoglycemia without coma: Secondary | ICD-10-CM | POA: Diagnosis not present

## 2020-05-14 DIAGNOSIS — E65 Localized adiposity: Secondary | ICD-10-CM

## 2020-05-14 DIAGNOSIS — R739 Hyperglycemia, unspecified: Secondary | ICD-10-CM

## 2020-05-14 DIAGNOSIS — E063 Autoimmune thyroiditis: Secondary | ICD-10-CM

## 2020-05-14 DIAGNOSIS — E109 Type 1 diabetes mellitus without complications: Secondary | ICD-10-CM

## 2020-05-14 DIAGNOSIS — E1065 Type 1 diabetes mellitus with hyperglycemia: Secondary | ICD-10-CM

## 2020-05-14 DIAGNOSIS — Z4681 Encounter for fitting and adjustment of insulin pump: Secondary | ICD-10-CM

## 2020-05-14 LAB — POCT GLYCOSYLATED HEMOGLOBIN (HGB A1C): Hemoglobin A1C: 7.4 % — AB (ref 4.0–5.6)

## 2020-05-14 LAB — POCT GLUCOSE (DEVICE FOR HOME USE): Glucose Fasting, POC: 189 mg/dL — AB (ref 70–99)

## 2020-05-14 NOTE — Patient Instructions (Signed)

## 2020-05-14 NOTE — Progress Notes (Signed)
Pediatric Endocrinology Diabetes Consultation Follow-up Visit  Elizabeth Stephenson May 13, 2001 [id number]  Chief Complaint: Follow-up type 1 diabetes   Elizabeth Blamer, MD   HPI: Elizabeth Stephenson  is a 19 y.o. female presenting for follow-up of type 1 diabetes. she is accompanied to this visit by her Mother.  1. Elizabeth Stephenson was diagnosed with type 1 diabetes when she was 19 years old. She believes it was the summer of 2012. Her older brother was diagnosed with type 1 diabetes at age 47. Mom recognized weight loss and other symptoms in Elizabeth Stephenson and tested her sugar at home- it was above 300 mg/dL. She called Dr. Tiburcio Pea at [facility] and started her on insulin at home. She was diagnosed with hypothyroidism around the same time. She is transferring care from her PCP office to pediatric endocrinology for management of these issues.    2. Since last visit to [facility] on 01/2020, she has been well.   She has been busy working 40 hours per week, has not decided to start back school yet. Working out 20 minutes per day "whenever I can" for activity.   She is wearing Tslim insulin pump and Dexcom CGM. They are working well overall. Reports having less hypoglycemia. She ran out of Dexcom CGM and had a week where she was unable to use it. Bolusing before eating most of the time which is helping decrease blood sugar spikes.   Taking 75 mcg of levothyroxine per day. Denies missed doses. No fatigue, constipation or cold intolerance.   Concerns:  - Recently witnessed brother having hypoglycemia related seizure. Has questions about severe hypoglycemia and use of glucagon/baqsimi.   Insulin regimen: Tslim insulin pump  Basal Rates 12AM 0.95  4am 1.025  6am 0.975  8am 1.02  9pm 1.07    Insulin to Carbohydrate Ratio 12AM 10  6am 8             Insulin Sensitivity Factor 12AM 30                Target Blood Glucose 12AM 150  6am 120  9pm 150           Hypoglycemia: Not feeling lows until under 60  now. Does not feel lows at night. No glucagon needed.  Insulin pump and CGM download   Med-alert ID: Not currently wearing. Injection sites: Abdomen (some scar tissue) and legs  Annual labs due: 07/2020 Ophthalmology due: 09/2020    3. ROS: Greater than 10 systems reviewed with pertinent positives listed in HPI, otherwise neg. Constitutional: Sleeping well. 5 lbs weight loss  Eyes: No changes in vision. No blurry vision. Wears glasses.  Ears/Nose/Mouth/Throat: No difficulty swallowing. No neck pain  Cardiovascular: No palpitations.  Respiratory: No increased work of breathing. No SOB  Gastrointestinal: No constipation or diarrhea. No abdominal pain Genitourinary: No nocturia, no polyuria Musculoskeletal: No joint pain Neurologic: Normal sensation, no tremor Endocrine: No polydipsia.  No hyperpigmentation Psychiatric: Normal affect. No depression or anxiety.   Past Medical History:   Past Medical History:  Diagnosis Date  . Thyroid disease   . Type 1 diabetes mellitus on insulin therapy (HCC) 05/26/2017    Medications:  Outpatient Encounter Medications as of 05/14/2020  Medication Sig Note  . insulin aspart (NOVOLOG) 100 UNIT/ML injection INJECT UP TO 300 UNITS VIA INSULIN PUMP EVERY 36 HOURS   . levothyroxine (SYNTHROID) 75 MCG tablet TAKE 1 TABLET BY MOUTH EVERY DAY BEFORE BREAKFAST   . ACCU-CHEK FASTCLIX LANCETS MISC 1 each as directed by  Does not apply route. Check sugar 6 x daily (Patient not taking: Reported on 05/14/2020)   . Continuous Blood Gluc Sensor (DEXCOM G6 SENSOR) MISC USE EVERY DAY AS AS NEEDED (Patient not taking: Reported on 05/14/2020)   . Continuous Blood Gluc Transmit (DEXCOM G6 TRANSMITTER) MISC USE AS DIRECTED (Patient not taking: Reported on 05/14/2020)   . glucagon 1 MG injection Follow package directions for low blood sugar. (Patient not taking: Reported on 02/12/2020) 02/12/2020: PRN emergencies  . glucose blood (ACCU-CHEK GUIDE) test strip Use as  instructed for 6 checks per day plus per protocol for hyper/hypoglycemia (Patient not taking: Reported on 05/14/2020)   . ibuprofen (ADVIL) 800 MG tablet Take 1 tablet (800 mg total) by mouth every 8 (eight) hours as needed. (Patient not taking: Reported on 02/12/2020)   . insulin aspart (NOVOLOG FLEXPEN) 100 UNIT/ML FlexPen Inject up to 50 units daily in case of pump failure (Patient not taking: Reported on 02/12/2020) 06/29/2019: Emergency use  . Insulin Glargine (LANTUS SOLOSTAR) 100 UNIT/ML Solostar Pen Up to 50 Units at bedtime and per care plan (Patient not taking: Reported on 02/12/2020) 02/12/2020: PRN pump failure  . Insulin Pen Needle (INSUPEN PEN NEEDLES) 32G X 4 MM MISC BD Pen Needles- brand specific. Inject insulin via insulin pen 6 x daily (Patient not taking: Reported on 02/12/2020)   . lidocaine-prilocaine (EMLA) cream Apply 1 application topically as needed. (Patient not taking: Reported on 02/12/2020)    No facility-administered encounter medications on file as of 05/14/2020.    Allergies: No Known Allergies  Surgical History: Past Surgical History:  Procedure Laterality Date  . APPENDECTOMY    . LAPAROSCOPIC APPENDECTOMY N/A 06/29/2019   Procedure: APPENDECTOMY LAPAROSCOPIC;  Surgeon: Karie Soda, MD;  Location: [facility];  Service: General;  Laterality: N/A;    Family History:  Family History  Problem Relation Age of Onset  . Hypertension Father   84 year old brother has Type 1 Diabetes.    Social History: Lives with: Mother and siblings. Stays with dad on weekends.  Looking for jobs. Wants to go to Cosmetology school   Physical Exam:  Vitals:   05/14/20 1138  BP: 132/82  Pulse: 90  Weight: 180 lb 6.4 oz (81.8 kg)   BP 132/82   Pulse 90   Wt 180 lb 6.4 oz (81.8 kg)   BMI 31.73 kg/m  Body mass index: body mass index is 31.73 kg/m. Growth percentile SmartLinks can only be used for patients less than 51 years old.  Ht Readings from Last 3 Encounters:   11/16/19 5' 3.23" (1.606 m) (34 %, Z= -0.42)*  08/18/19 5' 2.99" (1.6 m) (31 %, Z= -0.51)*  06/29/19 5\' 3"  (1.6 m) (31 %, Z= -0.50)*   * Growth percentiles are based on CDC (Girls, 2-20 Years) data.   Wt Readings from Last 3 Encounters:  05/14/20 180 lb 6.4 oz (81.8 kg)  02/12/20 185 lb 9.6 oz (84.2 kg) (96 %, Z= 1.70)*  11/16/19 174 lb 9.6 oz (79.2 kg) (93 %, Z= 1.49)*   * Growth percentiles are based on CDC (Girls, 2-20 Years) data.    Physical exam  General: Well developed, well nourished female in no acute distress.   Head: Normocephalic, atraumatic.   Eyes:  Pupils equal and round. EOMI.   Sclera white.  No eye drainage.   Ears/Nose/Mouth/Throat: Nares patent, no nasal drainage.  Normal dentition, mucous membranes moist.   Neck: supple, no cervical lymphadenopathy, no thyromegaly Cardiovascular: regular rate, normal S1/S2,  no murmurs Respiratory: No increased work of breathing.  Lungs clear to auscultation bilaterally.  No wheezes. Abdomen: soft, nontender, nondistended. Normal bowel sounds.  No appreciable masses  Extremities: warm, well perfused, cap refill < 2 sec.   Musculoskeletal: Normal muscle mass.  Normal strength Skin: warm, dry.  No rash or lesions. Neurologic: alert and oriented, normal speech, no tremor   Labs: Last hemoglobin A1c: 7.2% on 01/2020  Results for orders placed or performed in visit on 05/14/20  POCT glycosylated hemoglobin (Hb A1C)  Result Value Ref Range   Hemoglobin A1C 7.4 (A) 4.0 - 5.6 %   HbA1c POC (<> result, manual entry)     HbA1c, POC (prediabetic range)     HbA1c, POC (controlled diabetic range)    POCT Glucose (Device for Home Use)  Result Value Ref Range   Glucose Fasting, POC 189 (A) 70 - 99 mg/dL   POC Glucose        Assessment/Plan: Elizabeth Stephenson is a 19 y.o. female with type 1 diabetes in improving control on Tandem insulin pump and Dexcom CGM. Her blood sugars between 9pm-3am frequent become hypoglycemic if she gives a  correction bolus, will change her correction factor. Hemoglobin A1c is 7.4% today. Clinically euthyroid on 75 mcg of levothyroxine.   1-3. DM w/o complication type I, uncontrolled (HCC)/Hyperglycemia/hypoglycemia unawareness  - Reviewed insulin pump and CGM download. Discussed trends and patterns.  - Rotate pump sites to prevent scar tissue.  - bolus 15 minutes prior to eating to limit blood sugar spikes.  - Reviewed carb counting and importance of accurate carb counting.  - Discussed signs and symptoms of hypoglycemia. Always have glucose available.  - POCT glucose and hemoglobin A1c  - Reviewed growth chart.  - discussed used of Baqsimi for emergency. Discussed severe hypoglycemia in detail after her witnessing her brother having hypoglycemic related seizure.   3. Hypothyroidism, acquired, autoimmune -  75 mcg of levothyroxine  - TSH, FT4 and T4 ordered    4. Lipophypertrophy - Encouraged to frequently rotate insulin pump sites to new areas.   5. Insulin pump titration  Insulin Sensitivity Factor 12AM 30 --> 37  6am:  30  9pm 30--> 35            Follow-up:   3 months.    >45  spent today reviewing the medical chart, counseling the patient/family, and documenting today's visit.   When a patient is on insulin, intensive monitoring of blood glucose levels is necessary to avoid hyperglycemia and hypoglycemia. Severe hyperglycemia/hypoglycemia can lead to hospital admissions and be life threatening.     Gretchen Short,  FNP-C  Pediatric Specialist  [address]  [place], [address]  Tele: [phone]

## 2020-05-15 DIAGNOSIS — F4320 Adjustment disorder, unspecified: Secondary | ICD-10-CM | POA: Diagnosis not present

## 2020-05-23 ENCOUNTER — Encounter (INDEPENDENT_AMBULATORY_CARE_PROVIDER_SITE_OTHER): Payer: Self-pay

## 2020-06-05 DIAGNOSIS — F4320 Adjustment disorder, unspecified: Secondary | ICD-10-CM | POA: Diagnosis not present

## 2020-06-19 DIAGNOSIS — F4320 Adjustment disorder, unspecified: Secondary | ICD-10-CM | POA: Diagnosis not present

## 2020-06-27 DIAGNOSIS — E109 Type 1 diabetes mellitus without complications: Secondary | ICD-10-CM | POA: Diagnosis not present

## 2020-06-27 DIAGNOSIS — Z794 Long term (current) use of insulin: Secondary | ICD-10-CM | POA: Diagnosis not present

## 2020-07-03 DIAGNOSIS — F4320 Adjustment disorder, unspecified: Secondary | ICD-10-CM | POA: Diagnosis not present

## 2020-08-07 DIAGNOSIS — F4320 Adjustment disorder, unspecified: Secondary | ICD-10-CM | POA: Diagnosis not present

## 2020-08-09 ENCOUNTER — Other Ambulatory Visit (INDEPENDENT_AMBULATORY_CARE_PROVIDER_SITE_OTHER): Payer: Self-pay

## 2020-08-09 MED ORDER — INSULIN LISPRO 100 UNIT/ML ~~LOC~~ SOLN: SUBCUTANEOUS | 5 refills | Status: DC

## 2020-08-14 ENCOUNTER — Encounter (INDEPENDENT_AMBULATORY_CARE_PROVIDER_SITE_OTHER): Payer: Self-pay | Admitting: Family

## 2020-08-14 ENCOUNTER — Other Ambulatory Visit: Payer: Self-pay

## 2020-08-14 ENCOUNTER — Ambulatory Visit (INDEPENDENT_AMBULATORY_CARE_PROVIDER_SITE_OTHER): Payer: BC Managed Care – PPO | Admitting: Family

## 2020-08-14 VITALS — BP 118/72 | HR 74 | Wt 192.3 lb

## 2020-08-14 DIAGNOSIS — Z9641 Presence of insulin pump (external) (internal): Secondary | ICD-10-CM

## 2020-08-14 DIAGNOSIS — R739 Hyperglycemia, unspecified: Secondary | ICD-10-CM

## 2020-08-14 DIAGNOSIS — E109 Type 1 diabetes mellitus without complications: Secondary | ICD-10-CM

## 2020-08-14 DIAGNOSIS — E10649 Type 1 diabetes mellitus with hypoglycemia without coma: Secondary | ICD-10-CM

## 2020-08-14 DIAGNOSIS — E063 Autoimmune thyroiditis: Secondary | ICD-10-CM | POA: Diagnosis not present

## 2020-08-14 DIAGNOSIS — E65 Localized adiposity: Secondary | ICD-10-CM

## 2020-08-14 LAB — POCT GLUCOSE (DEVICE FOR HOME USE): POC Glucose: 153 mg/dl — AB (ref 70–99)

## 2020-08-14 LAB — POCT GLYCOSYLATED HEMOGLOBIN (HGB A1C): Hemoglobin A1C: 6.7 % — AB (ref 4.0–5.6)

## 2020-08-14 NOTE — Progress Notes (Signed)
Pediatric Endocrinology Diabetes Consultation Follow-up Visit  Elizabeth Stephenson 2000/08/12 [id number]  Chief Complaint: Follow-up type 1 diabetes   Elizabeth Blamer, MD   HPI: Elizabeth Stephenson  is a 20 y.o. female presenting for follow-up of type 1 diabetes. she is accompanied to this visit by her Mother.  1. Elizabeth Stephenson was diagnosed with type 1 diabetes when she was 20 years old. She believes it was the summer of 2012. Her older brother was diagnosed with type 1 diabetes at age 5. Mom recognized weight loss and other symptoms in Elizabeth Stephenson and tested her sugar at home- it was above 300 mg/dL. She called Dr. Tiburcio Pea at [facility] and started her on insulin at home. She was diagnosed with hypothyroidism around the same time. She is transferring care from her PCP office to pediatric endocrinology for management of these issues.    2. Since last visit to [facility] on 04/2020, she has been well.   She is looking for a new job. She has been working out a the gym about 3-4 days per week for activity.   Using Tslim insulin pump with Control IQ and Dexcom. Bolusing before meal and counting carbs more accurately which she feels is working better. Has started using extended bolus for more high fat meals. Rarely has hypoglycemia, usually when she is active or miscounts carbs.   Concerns:  - Schedule has changed. Staying up later which sometimes causes more variability in blood sugars.    Taking 75 mcg of levothyroxine per day. Denies missed doses. No fatigue, constipation or cold intolerance.    Insulin regimen: Tslim insulin pump  Basal Rates 12AM 0.95  4am 1.025  6am 0.975  8am 1.02  9pm 1.07    Insulin to Carbohydrate Ratio 12AM 10  6am 8             Insulin Sensitivity Factor 12AM 37   6am 30  9pm 35         Target Blood Glucose 12AM 150  6am 120  9pm 150           Hypoglycemia: Not feeling lows until under 60 now. Does not feel lows at night. No glucagon needed.  Insulin pump  and CGM download   Med-alert ID: Not currently wearing. Injection sites: Abdomen (some scar tissue) and legs  Annual labs due: 07/2020 Ophthalmology due: 09/2020    3. ROS: Greater than 10 systems reviewed with pertinent positives listed in HPI, otherwise neg. Constitutional: Sleeping well. 12 lb weight gain  Eyes: No changes in vision. No blurry vision. Wears glasses.  Ears/Nose/Mouth/Throat: No difficulty swallowing. No neck pain  Cardiovascular: No palpitations.  Respiratory: No increased work of breathing. No SOB  Gastrointestinal: No constipation or diarrhea. No abdominal pain Genitourinary: No nocturia, no polyuria Musculoskeletal: No joint pain Neurologic: Normal sensation, no tremor Endocrine: No polydipsia.  No hyperpigmentation Psychiatric: Normal affect. No depression or anxiety.   Past Medical History:   Past Medical History:  Diagnosis Date  . Thyroid disease   . Type 1 diabetes mellitus on insulin therapy (HCC) 05/26/2017    Medications:  Outpatient Encounter Medications as of 08/14/2020  Medication Sig Note  . insulin lispro (HUMALOG) 100 UNIT/ML injection INJECT UP TO 300 UNITS VIA INSULIN PUMP EVERY 36 HOURS   . levothyroxine (SYNTHROID) 75 MCG tablet TAKE 1 TABLET BY MOUTH EVERY DAY BEFORE BREAKFAST   . ACCU-CHEK FASTCLIX LANCETS MISC 1 each as directed by Does not apply route. Check sugar 6 x daily (Patient not  taking: Reported on 05/14/2020)   . Continuous Blood Gluc Sensor (DEXCOM G6 SENSOR) MISC USE EVERY DAY AS AS NEEDED (Patient not taking: Reported on 05/14/2020)   . Continuous Blood Gluc Transmit (DEXCOM G6 TRANSMITTER) MISC USE AS DIRECTED (Patient not taking: Reported on 05/14/2020)   . glucagon 1 MG injection Follow package directions for low blood sugar. (Patient not taking: Reported on 02/12/2020) 02/12/2020: PRN emergencies  . glucose blood (ACCU-CHEK GUIDE) test strip Use as instructed for 6 checks per day plus per protocol for hyper/hypoglycemia  (Patient not taking: Reported on 05/14/2020)   . ibuprofen (ADVIL) 800 MG tablet Take 1 tablet (800 mg total) by mouth every 8 (eight) hours as needed. (Patient not taking: Reported on 02/12/2020)   . insulin aspart (NOVOLOG FLEXPEN) 100 UNIT/ML FlexPen Inject up to 50 units daily in case of pump failure (Patient not taking: Reported on 02/12/2020) 06/29/2019: Emergency use  . insulin aspart (NOVOLOG) 100 UNIT/ML injection INJECT UP TO 300 UNITS VIA INSULIN PUMP EVERY 36 HOURS (Patient not taking: Reported on 08/14/2020)   . Insulin Glargine (LANTUS SOLOSTAR) 100 UNIT/ML Solostar Pen Up to 50 Units at bedtime and per care plan (Patient not taking: Reported on 02/12/2020) 02/12/2020: PRN pump failure  . Insulin Pen Needle (INSUPEN PEN NEEDLES) 32G X 4 MM MISC BD Pen Needles- brand specific. Inject insulin via insulin pen 6 x daily (Patient not taking: Reported on 02/12/2020)   . lidocaine-prilocaine (EMLA) cream Apply 1 application topically as needed. (Patient not taking: Reported on 02/12/2020)    No facility-administered encounter medications on file as of 08/14/2020.    Allergies: No Known Allergies  Surgical History: Past Surgical History:  Procedure Laterality Date  . APPENDECTOMY    . LAPAROSCOPIC APPENDECTOMY N/A 06/29/2019   Procedure: APPENDECTOMY LAPAROSCOPIC;  Surgeon: Karie Soda, MD;  Location: [facility];  Service: General;  Laterality: N/A;    Family History:  Family History  Problem Relation Age of Onset  . Hypertension Father   26 year old brother has Type 1 Diabetes.    Social History: Lives with: Mother and siblings. Stays with dad on weekends.  Looking for jobs. Wants to go to Cosmetology school   Physical Exam:  Vitals:   08/14/20 1448  BP: 118/72  Pulse: 74  Weight: 192 lb 4.8 oz (87.2 kg)   BP 118/72   Pulse 74   Wt 192 lb 4.8 oz (87.2 kg)   BMI 33.82 kg/m  Body mass index: body mass index is 33.82 kg/m. Growth percentile SmartLinks can only be used for  patients less than 73 years old.  Ht Readings from Last 3 Encounters:  11/16/19 5' 3.23" (1.606 m) (34 %, Z= -0.42)*  08/18/19 5' 2.99" (1.6 m) (31 %, Z= -0.51)*  06/29/19 5\' 3"  (1.6 m) (31 %, Z= -0.50)*   * Growth percentiles are based on CDC (Girls, 2-20 Years) data.   Wt Readings from Last 3 Encounters:  08/14/20 192 lb 4.8 oz (87.2 kg)  05/14/20 180 lb 6.4 oz (81.8 kg)  02/12/20 185 lb 9.6 oz (84.2 kg) (96 %, Z= 1.70)*   * Growth percentiles are based on CDC (Girls, 2-20 Years) data.    Physical exam General: Well developed, well nourished female in no acute distress. Head: Normocephalic, atraumatic.   Eyes:  Pupils equal and round. EOMI.   Sclera white.  No eye drainage.   Ears/Nose/Mouth/Throat: Nares patent, no nasal drainage.  Normal dentition, mucous membranes moist.   Neck: supple, no cervical  lymphadenopathy, no thyromegaly Cardiovascular: regular rate, normal S1/S2, no murmurs Respiratory: No increased work of breathing.  Lungs clear to auscultation bilaterally.  No wheezes. Abdomen: soft, nontender, nondistended. Normal bowel sounds.  No appreciable masses  Extremities: warm, well perfused, cap refill < 2 sec.   Musculoskeletal: Normal muscle mass.  Normal strength Skin: warm, dry.  No rash or lesions. Neurologic: alert and oriented, normal speech, no tremor   Labs: Last hemoglobin A1c: 7.4% on 04/2020  Results for orders placed or performed in visit on 08/14/20  POCT glycosylated hemoglobin (Hb A1C)  Result Value Ref Range   Hemoglobin A1C 6.7 (A) 4.0 - 5.6 %   HbA1c POC (<> result, manual entry)     HbA1c, POC (prediabetic range)     HbA1c, POC (controlled diabetic range)    POCT Glucose (Device for Home Use)  Result Value Ref Range   Glucose Fasting, POC     POC Glucose 153 (A) 70 - 99 mg/dl      Assessment/Plan: Yemaya is a 20 y.o. female with type 1 diabetes in improving control on Tandem insulin pump and Dexcom CGM. Improvement in glucose control  and time in range since last visit. Hemoglobin A1c has improve dto 6.7% which meets the ADA goal of <7.5%.    1-3. DM w/o complication type I, uncontrolled (HCC)/Hyperglycemia/hypoglycemia unawareness  - Reviewed insulin pump and CGM download. Discussed trends and patterns.  - Rotate pump sites to prevent scar tissue.  - bolus 15 minutes prior to eating to limit blood sugar spikes.  - Reviewed carb counting and importance of accurate carb counting.  - Discussed signs and symptoms of hypoglycemia. Always have glucose available.  - POCT glucose and hemoglobin A1c  - Reviewed growth chart.  - Discussed adjusting pump settings for activity (use activity mode on pump)  - Lipid panel, microalbumin ordered   3. Hypothyroidism, acquired, autoimmune -  75 mcg of levothyroxine  - Discussed s/s of hypothyroidism  - TSh, FT4 and T4 ordered   4. Lipophypertrophy - Encouraged to frequently rotate insulin pump sites to new areas.   5. Insulin pump titration  NO changes today. Pump in place.   Follow-up:   3 months.   >45 spent today reviewing the medical chart, counseling the patient/family, and documenting today's visit.    When a patient is on insulin, intensive monitoring of blood glucose levels is necessary to avoid hyperglycemia and hypoglycemia. Severe hyperglycemia/hypoglycemia can lead to hospital admissions and be life threatening.     Gretchen Short,  FNP-C  Pediatric Specialist  [address]  [place], [address]  Tele: [phone]

## 2020-08-14 NOTE — Patient Instructions (Signed)

## 2020-08-16 LAB — MICROALBUMIN / CREATININE URINE RATIO
Creatinine, Urine: 151 mg/dL (ref 20–275)
Microalb Creat Ratio: 95 mcg/mg creat — ABNORMAL HIGH (ref <30)
Microalb, Ur: 14.4 mg/dL

## 2020-08-16 LAB — LIPID PANEL
Cholesterol: 146 mg/dL (ref <200)
HDL: 37 mg/dL — ABNORMAL LOW (ref >=50)
LDL Cholesterol (Calc): 86 mg/dL (calc)
Non-HDL Cholesterol (Calc): 109 mg/dL (calc) (ref <130)
Total CHOL/HDL Ratio: 3.9 (calc) (ref <5.0)
Triglycerides: 130 mg/dL (ref <150)

## 2020-08-16 LAB — TSH: TSH: 0.5 mIU/L

## 2020-08-16 LAB — T4, FREE: Free T4: 2 ng/dL — ABNORMAL HIGH (ref 0.8–1.4)

## 2020-08-21 ENCOUNTER — Other Ambulatory Visit (INDEPENDENT_AMBULATORY_CARE_PROVIDER_SITE_OTHER): Payer: Self-pay

## 2020-08-21 DIAGNOSIS — F4320 Adjustment disorder, unspecified: Secondary | ICD-10-CM | POA: Diagnosis not present

## 2020-08-21 DIAGNOSIS — E109 Type 1 diabetes mellitus without complications: Secondary | ICD-10-CM

## 2020-08-24 ENCOUNTER — Encounter (INDEPENDENT_AMBULATORY_CARE_PROVIDER_SITE_OTHER): Payer: Self-pay

## 2020-09-04 DIAGNOSIS — F4320 Adjustment disorder, unspecified: Secondary | ICD-10-CM | POA: Diagnosis not present

## 2020-09-05 ENCOUNTER — Other Ambulatory Visit (INDEPENDENT_AMBULATORY_CARE_PROVIDER_SITE_OTHER): Payer: Self-pay | Admitting: Family

## 2020-09-05 DIAGNOSIS — E1065 Type 1 diabetes mellitus with hyperglycemia: Secondary | ICD-10-CM

## 2020-09-05 DIAGNOSIS — IMO0002 Reserved for concepts with insufficient information to code with codable children: Secondary | ICD-10-CM

## 2020-09-18 DIAGNOSIS — F4320 Adjustment disorder, unspecified: Secondary | ICD-10-CM | POA: Diagnosis not present

## 2020-09-29 ENCOUNTER — Other Ambulatory Visit (INDEPENDENT_AMBULATORY_CARE_PROVIDER_SITE_OTHER): Payer: Self-pay | Admitting: Family

## 2020-09-29 DIAGNOSIS — E063 Autoimmune thyroiditis: Secondary | ICD-10-CM

## 2020-09-30 DIAGNOSIS — E109 Type 1 diabetes mellitus without complications: Secondary | ICD-10-CM | POA: Diagnosis not present

## 2020-09-30 DIAGNOSIS — R030 Elevated blood-pressure reading, without diagnosis of hypertension: Secondary | ICD-10-CM | POA: Diagnosis not present

## 2020-09-30 DIAGNOSIS — R809 Proteinuria, unspecified: Secondary | ICD-10-CM | POA: Diagnosis not present

## 2020-10-10 IMAGING — CT CT ABD-PELV W/ CM
2 of 4 series · 15 of 46 positions shown, 17 images · IV contrast (omnipaque)
Comparison: None.

CLINICAL DATA: Right lower quadrant pain with nausea and vomiting

EXAM:
CT ABDOMEN AND PELVIS WITH CONTRAST
TECHNIQUE: Multidetector CT imaging of the abdomen and pelvis was performed
using the standard protocol following bolus administration of
intravenous contrast.
CONTRAST:  100mL OMNIPAQUE IOHEXOL 300 MG/ML  SOLN

[Series 2: axial st · axial · 0.74mm/px · z∈[-693,-283]mm · 12 of 94 slices shown, 14 images]
[im 6/94  soft-tissue]
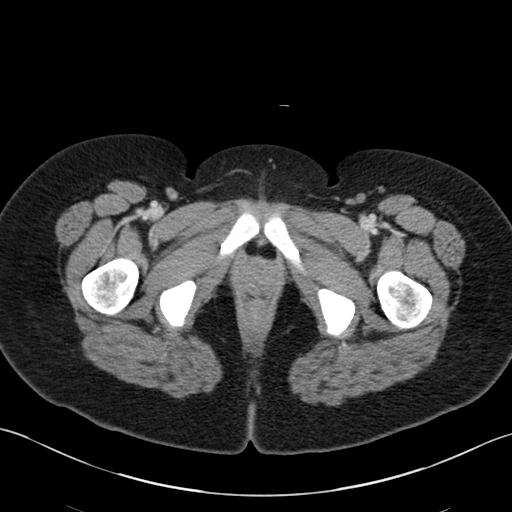
[im 6/94  bone]
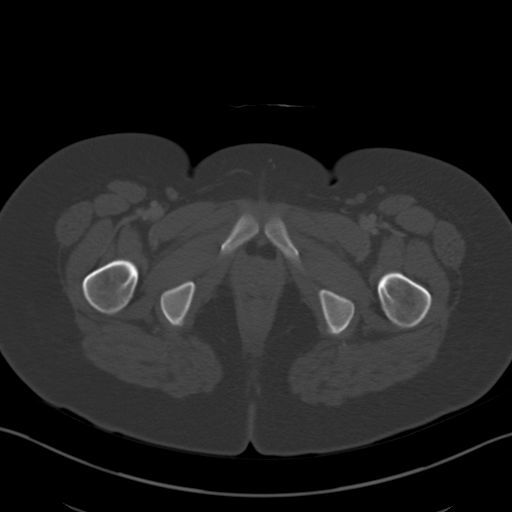
[im 17/94  soft-tissue]
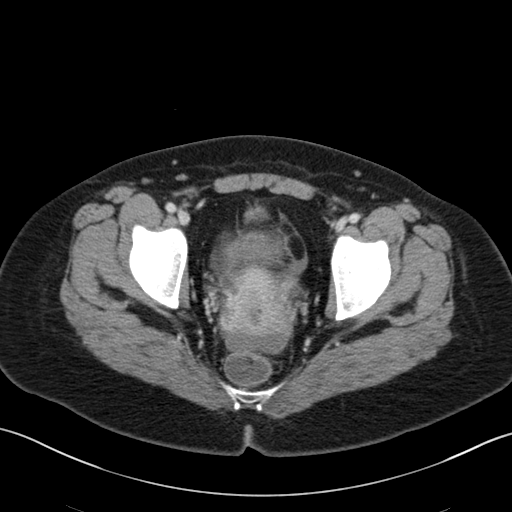
[im 22/94  soft-tissue]
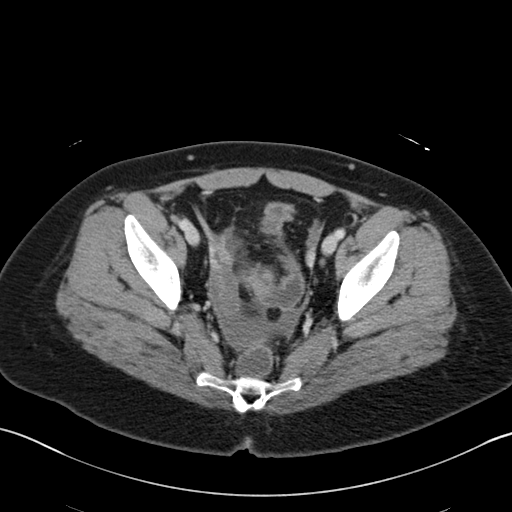
[im 28/94  soft-tissue]
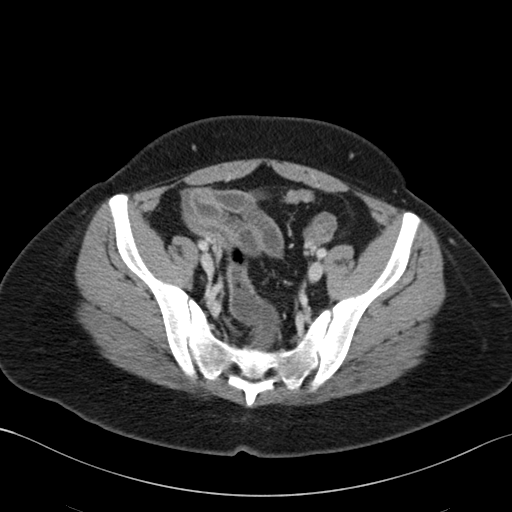
[im 39/94  soft-tissue]
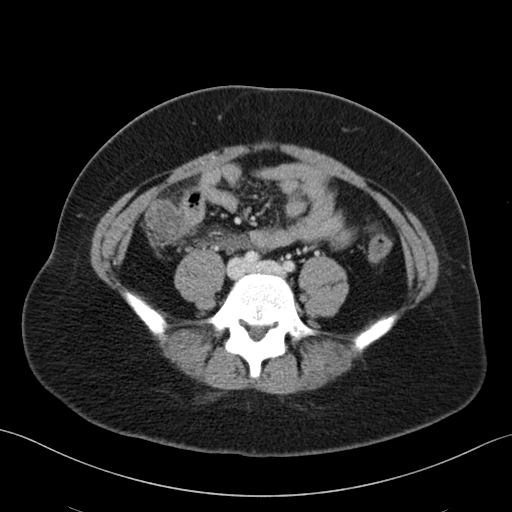
[im 44/94  soft-tissue]
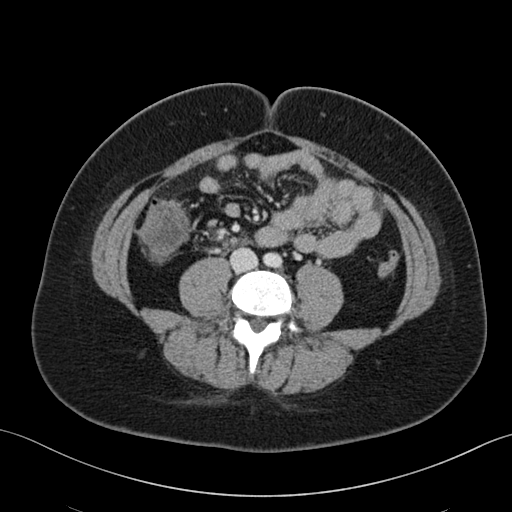
[im 50/94  soft-tissue]
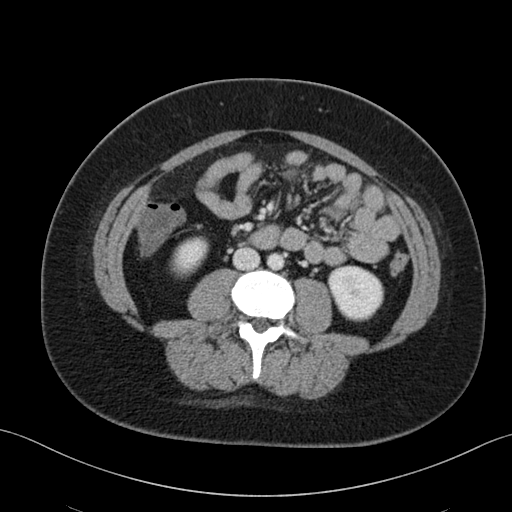
[im 61/94  soft-tissue]
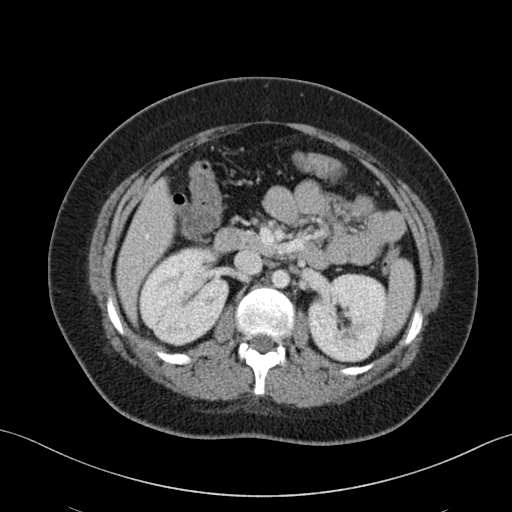
[im 66/94  soft-tissue]
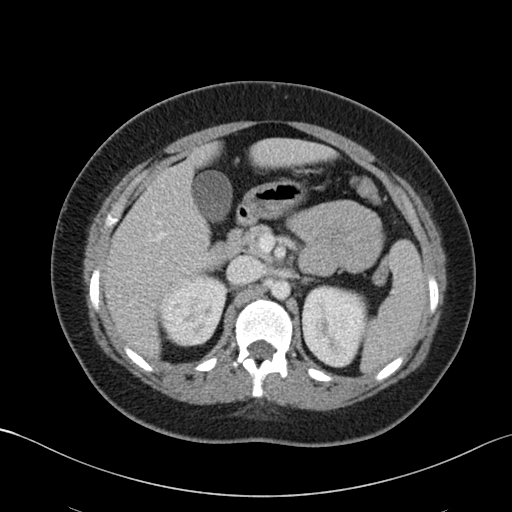
[im 66/94  bone]
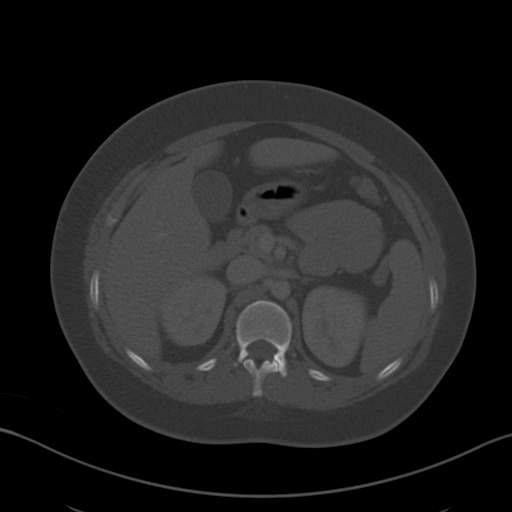
[im 72/94  soft-tissue]
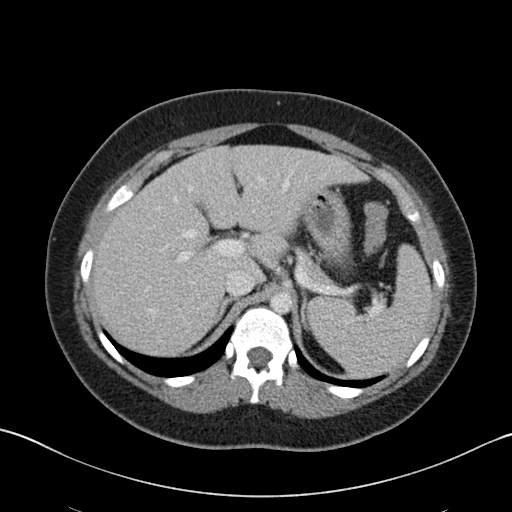
[im 83/94  soft-tissue]
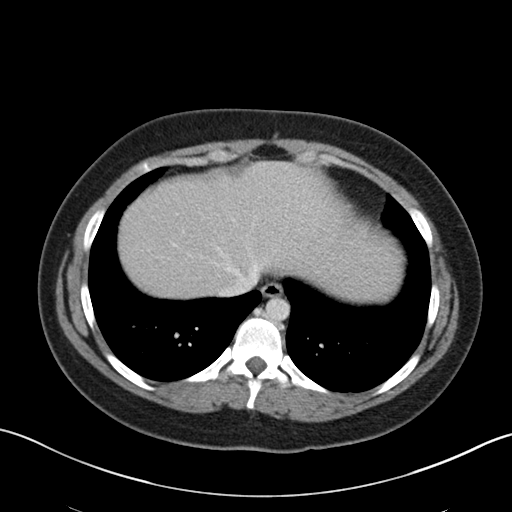
[im 88/94  soft-tissue]
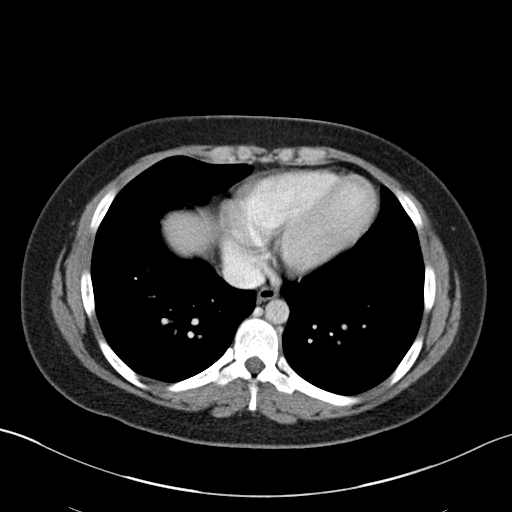

[Series 5: coronal st · coronal · 0.62mm/px · 3 of 135 slices shown]
[im 45/135  soft-tissue]
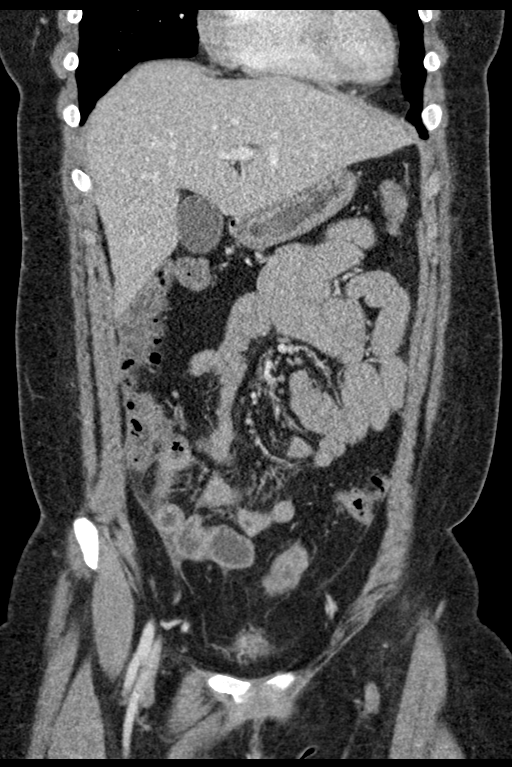
[im 60/135  soft-tissue]
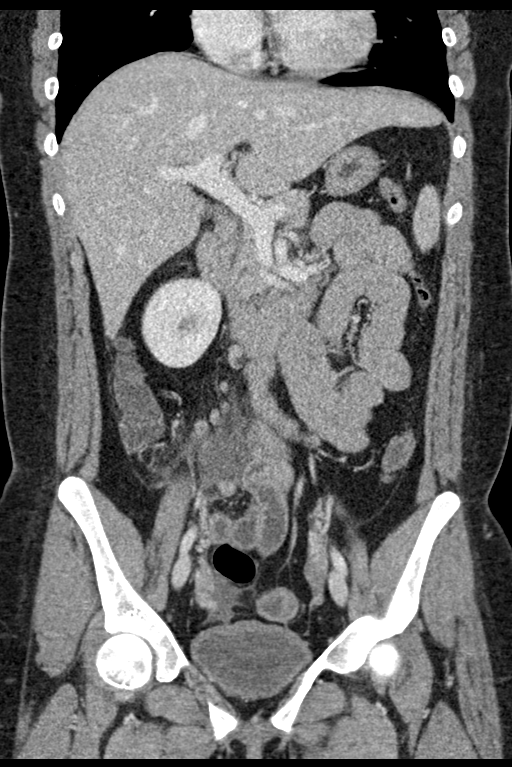
[im 75/135  soft-tissue]
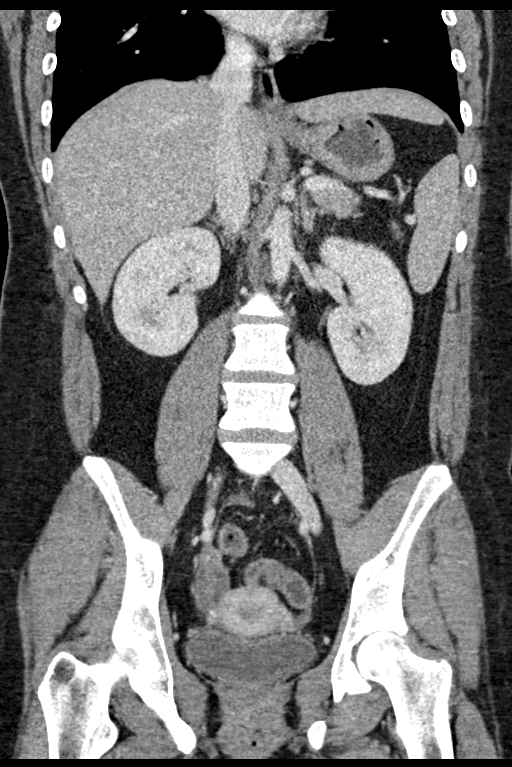

[15 of 46 positions shown; findings below may reference images not displayed]

FINDINGS: Lower chest: No acute abnormality.

Hepatobiliary: No focal liver abnormality is seen. No gallstones,
gallbladder wall thickening, or biliary dilatation.

Pancreas: Unremarkable. No pancreatic ductal dilatation or
surrounding inflammatory changes.

Spleen: Normal in size without focal abnormality.

Adrenals/Urinary Tract: Adrenal glands are within normal limits.
Kidneys are well visualized bilaterally. No renal calculi or
obstructive changes are seen. The ureters are within normal limits
to the bladder. The bladder is partially distended.

Stomach/Bowel: Fluid-filled loops of colon are noted without
obstructive change. No small bowel obstructive changes are noted.
Stomach is within normal limits.

There are changes consistent with acute appendicitis.

Appendix: Location: Infra cecal and medial

Diameter: 16 mm

Appendicolith: Absent

Mucosal hyper-enhancement: Present

Extraluminal gas: Small foci of extraluminal air is noted.

Periappendiceal collection: Periappendiceal inflammatory changes are
noted with a small amount of fluid just below the cecal tip although
no demonstrable abscess is seen. Some reactive inflammatory changes
of the terminal ileum are noted.

Vascular/Lymphatic: No significant vascular findings are present. No
enlarged abdominal or pelvic lymph nodes.

Reproductive: Uterus and bilateral adnexa are unremarkable.

Other: No hernia is seen. A moderate amount of free pelvic fluid is
noted.

Musculoskeletal: No acute or significant osseous findings.
IMPRESSION: Changes consistent with acute appendicitis with evidence of free
pelvic fluid as well as a small amount of periappendiceal fluid and
inflammatory change. A small focus of extraluminal air is noted as
well likely related to micro perforation. Some reactive inflammatory
changes in the distal ileum are seen as well.

No other focal abnormality is noted.

Critical Value/emergent results were called by telephone at the time
of interpretation on 06/29/2019 at [DATE] to Jurien Kamer, PA, who
verbally acknowledged these results.

## 2020-10-14 DIAGNOSIS — R809 Proteinuria, unspecified: Secondary | ICD-10-CM | POA: Diagnosis not present

## 2020-10-16 DIAGNOSIS — F4320 Adjustment disorder, unspecified: Secondary | ICD-10-CM | POA: Diagnosis not present

## 2020-11-06 DIAGNOSIS — F4320 Adjustment disorder, unspecified: Secondary | ICD-10-CM | POA: Diagnosis not present

## 2020-11-12 ENCOUNTER — Ambulatory Visit (INDEPENDENT_AMBULATORY_CARE_PROVIDER_SITE_OTHER): Payer: BC Managed Care – PPO | Admitting: Family

## 2020-11-12 ENCOUNTER — Other Ambulatory Visit: Payer: Self-pay

## 2020-11-12 ENCOUNTER — Encounter (INDEPENDENT_AMBULATORY_CARE_PROVIDER_SITE_OTHER): Payer: Self-pay | Admitting: Family

## 2020-11-12 VITALS — BP 120/78 | HR 76 | Wt 191.0 lb

## 2020-11-12 DIAGNOSIS — E1029 Type 1 diabetes mellitus with other diabetic kidney complication: Secondary | ICD-10-CM | POA: Diagnosis not present

## 2020-11-12 DIAGNOSIS — Z9641 Presence of insulin pump (external) (internal): Secondary | ICD-10-CM

## 2020-11-12 DIAGNOSIS — R739 Hyperglycemia, unspecified: Secondary | ICD-10-CM

## 2020-11-12 DIAGNOSIS — E10649 Type 1 diabetes mellitus with hypoglycemia without coma: Secondary | ICD-10-CM

## 2020-11-12 DIAGNOSIS — R809 Proteinuria, unspecified: Secondary | ICD-10-CM | POA: Diagnosis not present

## 2020-11-12 DIAGNOSIS — E063 Autoimmune thyroiditis: Secondary | ICD-10-CM

## 2020-11-12 LAB — POCT GLYCOSYLATED HEMOGLOBIN (HGB A1C): Hemoglobin A1C: 7 % — AB (ref 4.0–5.6)

## 2020-11-12 LAB — POCT GLUCOSE (DEVICE FOR HOME USE): POC Glucose: 260 mg/dl — AB (ref 70–99)
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
## 2020-11-27 DIAGNOSIS — F4320 Adjustment disorder, unspecified: Secondary | ICD-10-CM | POA: Diagnosis not present

## 2020-11-28 DIAGNOSIS — Z794 Long term (current) use of insulin: Secondary | ICD-10-CM | POA: Diagnosis not present

## 2020-11-28 DIAGNOSIS — E109 Type 1 diabetes mellitus without complications: Secondary | ICD-10-CM | POA: Diagnosis not present

## 2020-12-02 DIAGNOSIS — E109 Type 1 diabetes mellitus without complications: Secondary | ICD-10-CM | POA: Diagnosis not present

## 2020-12-02 DIAGNOSIS — Z794 Long term (current) use of insulin: Secondary | ICD-10-CM | POA: Diagnosis not present

## 2020-12-03 DIAGNOSIS — R000 Tachycardia, unspecified: Secondary | ICD-10-CM | POA: Diagnosis not present

## 2020-12-03 DIAGNOSIS — R809 Proteinuria, unspecified: Secondary | ICD-10-CM | POA: Diagnosis not present

## 2020-12-03 DIAGNOSIS — R030 Elevated blood-pressure reading, without diagnosis of hypertension: Secondary | ICD-10-CM | POA: Diagnosis not present

## 2020-12-03 DIAGNOSIS — E109 Type 1 diabetes mellitus without complications: Secondary | ICD-10-CM | POA: Diagnosis not present

## 2020-12-04 DIAGNOSIS — F4320 Adjustment disorder, unspecified: Secondary | ICD-10-CM | POA: Diagnosis not present

## 2020-12-18 ENCOUNTER — Other Ambulatory Visit (INDEPENDENT_AMBULATORY_CARE_PROVIDER_SITE_OTHER): Payer: Self-pay

## 2020-12-18 DIAGNOSIS — F4320 Adjustment disorder, unspecified: Secondary | ICD-10-CM | POA: Diagnosis not present

## 2020-12-18 MED ORDER — HUMALOG 100 UNIT/ML ~~LOC~~ SOLN: SUBCUTANEOUS | 5 refills | Status: AC

## 2020-12-23 ENCOUNTER — Other Ambulatory Visit (INDEPENDENT_AMBULATORY_CARE_PROVIDER_SITE_OTHER): Payer: Self-pay | Admitting: Family

## 2020-12-31 ENCOUNTER — Other Ambulatory Visit (INDEPENDENT_AMBULATORY_CARE_PROVIDER_SITE_OTHER): Payer: Self-pay | Admitting: Family

## 2020-12-31 DIAGNOSIS — E063 Autoimmune thyroiditis: Secondary | ICD-10-CM

## 2021-01-16 DIAGNOSIS — Z794 Long term (current) use of insulin: Secondary | ICD-10-CM | POA: Diagnosis not present

## 2021-01-16 DIAGNOSIS — E109 Type 1 diabetes mellitus without complications: Secondary | ICD-10-CM | POA: Diagnosis not present

## 2021-02-12 ENCOUNTER — Other Ambulatory Visit: Payer: Self-pay

## 2021-02-12 ENCOUNTER — Encounter (INDEPENDENT_AMBULATORY_CARE_PROVIDER_SITE_OTHER): Payer: Self-pay | Admitting: Family

## 2021-02-12 ENCOUNTER — Ambulatory Visit (INDEPENDENT_AMBULATORY_CARE_PROVIDER_SITE_OTHER): Payer: BC Managed Care – PPO | Admitting: Family

## 2021-02-12 VITALS — BP 126/82 | HR 92 | Wt 194.6 lb

## 2021-02-12 DIAGNOSIS — R809 Proteinuria, unspecified: Secondary | ICD-10-CM | POA: Diagnosis not present

## 2021-02-12 DIAGNOSIS — E063 Autoimmune thyroiditis: Secondary | ICD-10-CM | POA: Diagnosis not present

## 2021-02-12 DIAGNOSIS — Z4681 Encounter for fitting and adjustment of insulin pump: Secondary | ICD-10-CM | POA: Diagnosis not present

## 2021-02-12 DIAGNOSIS — E1029 Type 1 diabetes mellitus with other diabetic kidney complication: Secondary | ICD-10-CM

## 2021-02-12 LAB — POCT GLYCOSYLATED HEMOGLOBIN (HGB A1C): Hemoglobin A1C: 6.8 % — AB (ref 4.0–5.6)

## 2021-02-12 LAB — POCT GLUCOSE (DEVICE FOR HOME USE): POC Glucose: 262 mg/dl — AB (ref 70–99)

## 2021-02-12 NOTE — Patient Instructions (Signed)
Basal Rates 12AM 1.0 --> 1.07  4am 1.025  6am 0.975  8am 1.02  9pm 1.07--> 1.15     It was a pleasure seeing you in clinic today. Please do not hesitate to contact me if you have questions or concerns.

## 2021-02-12 NOTE — Progress Notes (Signed)
Pediatric Endocrinology Diabetes Consultation Follow-up Visit  Elizabeth Stephenson Dec 11, 2000 [id number]  Chief Complaint: Follow-up type 1 diabetes   [name], MD   HPI: Elizabeth Stephenson  is a 20 y.o. female presenting for follow-up of type 1 diabetes. she is accompanied to this visit by her Mother.  1. Elizabeth Stephenson was diagnosed with type 1 diabetes when she was 20 years old. She believes it was the summer of 2012. Her older brother was diagnosed with type 1 diabetes at age 3. Mom recognized weight loss and other symptoms in Elizabeth Stephenson and tested her sugar at home- it was above 300 mg/dL. She called Dr. Tiburcio Pea at [facility] and started her on insulin at home. She was diagnosed with hypothyroidism around the same time. She is transferring care from her PCP office to pediatric endocrinology for management of these issues.    2. Since last visit to [facility] on 10/2020, she has been well.   She is currently applying for jobs and considering cosmetology school. She is exercising daily, usually going for walks, running, jump rope and yoga. Tries to get at least 45 minutes per day.   Reports she is doing well overall with diabetes care. She is using Tslim insulin pump and Dexcom CGM. She is trying to bolus more frequently before eating. When she snacks late at night her blood sugars tend to run higher. Hypoglycemia is rare.   Taking 75 mcg of levothyroxine per day. Rarely misses doses but will double it the next day if she does.  No fatigue, constipation or cold intolerance.   Followed by Nephrology and currently taking Losartan daily.    Insulin regimen: Tslim insulin pump  Basal Rates 12AM 1.0   4am 1.025  6am 0.975  8am 1.02  9pm 1.07    Insulin to Carbohydrate Ratio 12AM 10  4am 10   6am 9  8am 9  9pm 8     Insulin Sensitivity Factor 12AM 37   6am 30  9pm 35         Target Blood Glucose 12AM 150  6am 120  9pm 150           Hypoglycemia: Not feeling lows until under 60  now. Does not feel lows at night. No glucagon needed.  Insulin pump and CGM download   Med-alert ID: Not currently wearing. Injection sites: Abdomen (some scar tissue) and legs  Annual labs due: 07/2021 Ophthalmology due: 09/2020    3. ROS: Greater than 10 systems reviewed with pertinent positives listed in HPI, otherwise neg. Constitutional: Sleeping well. 3 lbs weight gain  Eyes: No changes in vision. No blurry vision. Wears glasses.  Ears/Nose/Mouth/Throat: No difficulty swallowing. No neck pain  Cardiovascular: No palpitations.  Respiratory: No increased work of breathing. No SOB  Gastrointestinal: No constipation or diarrhea. No abdominal pain Genitourinary: No nocturia, no polyuria Musculoskeletal: No joint pain Neurologic: Normal sensation, no tremor Endocrine: No polydipsia.  No hyperpigmentation Psychiatric: Normal affect. No depression or anxiety.   Past Medical History:   Past Medical History:  Diagnosis Date   Thyroid disease    Type 1 diabetes mellitus on insulin therapy (HCC) 05/26/2017    Medications:  Outpatient Encounter Medications as of 02/12/2021  Medication Sig Note   Continuous Blood Gluc Sensor (DEXCOM G6 SENSOR) MISC INJECT 1 PRODUCT INTO THE SKIN AS DIRECTED    Continuous Blood Gluc Transmit (DEXCOM G6 TRANSMITTER) MISC USE AS DIRECTED    insulin aspart (NOVOLOG) 100 UNIT/ML injection INJECT UP TO 300 UNITS  VIA INSULIN PUMP EVERY 36 HOURS    Insulin Pen Needle (INSUPEN PEN NEEDLES) 32G X 4 MM MISC BD Pen Needles- brand specific. Inject insulin via insulin pen 6 x daily    levothyroxine (SYNTHROID) 75 MCG tablet TAKE 1 TABLET BY MOUTH EVERY DAY BEFORE BREAKFAST    losartan (COZAAR) 25 MG tablet Take 12.5 mg by mouth daily.    ACCU-CHEK FASTCLIX LANCETS MISC 1 each as directed by Does not apply route. Check sugar 6 x daily (Patient not taking: No sig reported)    glucagon 1 MG injection Follow package directions for low blood sugar. (Patient not taking: No  sig reported) 02/12/2020: PRN emergencies   glucose blood (ACCU-CHEK GUIDE) test strip Use as instructed for 6 checks per day plus per protocol for hyper/hypoglycemia (Patient not taking: No sig reported)    ibuprofen (ADVIL) 800 MG tablet Take 1 tablet (800 mg total) by mouth every 8 (eight) hours as needed. (Patient not taking: No sig reported)    insulin aspart (NOVOLOG FLEXPEN) 100 UNIT/ML FlexPen Inject up to 50 units daily in case of pump failure (Patient not taking: No sig reported) 06/29/2019: Emergency use   Insulin Glargine (LANTUS SOLOSTAR) 100 UNIT/ML Solostar Pen Up to 50 Units at bedtime and per care plan (Patient not taking: No sig reported) 02/12/2020: PRN pump failure   insulin lispro (HUMALOG) 100 UNIT/ML injection INJECT UP TO 300 UNITS VIA INSULIN PUMP EVERY 36 HOURS (Patient not taking: Reported on 02/12/2021)    lidocaine-prilocaine (EMLA) cream Apply 1 application topically as needed. (Patient not taking: No sig reported)    No facility-administered encounter medications on file as of 02/12/2021.    Allergies: No Known Allergies  Surgical History: Past Surgical History:  Procedure Laterality Date   APPENDECTOMY     LAPAROSCOPIC APPENDECTOMY N/A 06/29/2019   Procedure: APPENDECTOMY LAPAROSCOPIC;  Surgeon: Karie Soda, MD;  Location: [facility];  Service: General;  Laterality: N/A;    Family History:  Family History  Problem Relation Age of Onset   Hypertension Father   Older brother has Type 1 Diabetes.    Social History: Lives with: father and siblings.  Starting cosmetology school   Physical Exam:  Vitals:   02/12/21 1503  BP: 126/82  Pulse: 92  Weight: 194 lb 9.6 oz (88.3 kg)    BP 126/82 (BP Location: Right Arm, Patient Position: Sitting)   Pulse 92   Wt 194 lb 9.6 oz (88.3 kg)   BMI 34.22 kg/m  Body mass index: body mass index is 34.22 kg/m. Growth percentile SmartLinks can only be used for patients less than 36 years old.  Ht Readings from Last 3  Encounters:  11/16/19 5' 3.23" (1.606 m) (34 %, Z= -0.42)*  08/18/19 5' 2.99" (1.6 m) (31 %, Z= -0.51)*  06/29/19 5\' 3"  (1.6 m) (31 %, Z= -0.50)*   * Growth percentiles are based on CDC (Girls, 2-20 Years) data.   Wt Readings from Last 3 Encounters:  02/12/21 194 lb 9.6 oz (88.3 kg)  11/12/20 191 lb (86.6 kg)  08/14/20 192 lb 4.8 oz (87.2 kg)    Physical exam General: Well developed, well nourished female in no acute distress.   Head: Normocephalic, atraumatic.   Eyes:  Pupils equal and round. EOMI.   Sclera white.  No eye drainage.  + glasses Ears/Nose/Mouth/Throat: Nares patent, no nasal drainage.  Normal dentition, mucous membranes moist.   Neck: supple, no cervical lymphadenopathy, no thyromegaly Cardiovascular: regular rate, normal S1/S2, no murmurs  Respiratory: No increased work of breathing.  Lungs clear to auscultation bilaterally.  No wheezes. Abdomen: soft, nontender, nondistended. Normal bowel sounds.  No appreciable masses  Extremities: warm, well perfused, cap refill < 2 sec.   Musculoskeletal: Normal muscle mass.  Normal strength Skin: warm, dry.  No rash or lesions. Neurologic: alert and oriented, normal speech, no tremor    Labs: Last hemoglobin A1c: 7%% on 10/2020  Results for orders placed or performed in visit on 02/12/21  POCT glycosylated hemoglobin (Hb A1C)  Result Value Ref Range   Hemoglobin A1C 6.8 (A) 4.0 - 5.6 %   HbA1c POC (<> result, manual entry)     HbA1c, POC (prediabetic range)     HbA1c, POC (controlled diabetic range)    POCT Glucose (Device for Home Use)  Result Value Ref Range   Glucose Fasting, POC     POC Glucose 262 (A) 70 - 99 mg/dl      Assessment/Plan: Elizabeth Stephenson is a 20 y.o. female with type 1 diabetes in improving control on Tandem insulin pump and Dexcom CGM. Having a pattern of hyperglycemia between 6pm-3am. Hemoglobin A1c is 6.8% which meets the ADA goal of <7.5%. She is clinically euthyroid on 75 mcg of levothyroxine per  day   1-3. DM w/o complication type I, uncontrolled (HCC)/Hyperglycemia/hypoglycemia unawareness  - Reviewed insulin pump and CGM download. Discussed trends and patterns.  - Rotate pump sites to prevent scar tissue.  - bolus 15 minutes prior to eating to limit blood sugar spikes.  - Reviewed carb counting and importance of accurate carb counting.  - Discussed signs and symptoms of hypoglycemia. Always have glucose available.  - POCT glucose and hemoglobin A1c  - Reviewed growth chart.  - Encouraged to decrease late night snacks. Discussed how blood sugars are affected.   3. Hypothyroidism, acquired, autoimmune -  75 mcg of levothyroxine per day  - TSH, FT4 and T4 ordered   4. Insulin pump titration  Basal Rates 12AM 1.0 --> 1.07  4am 1.025  6am 0.975  8am 1.02  9pm 1.07--> 1.15     5 Microalbuminuria  - Followed by neprhology  - Continue Losartan  -Stressed importance of good glucose control.   Follow-up:   3 months.   >45  spent today reviewing the medical chart, counseling the patient/family, and documenting today's visit.    When a patient is on insulin, intensive monitoring of blood glucose levels is necessary to avoid hyperglycemia and hypoglycemia. Severe hyperglycemia/hypoglycemia can lead to hospital admissions and be life threatening.     Gretchen Short,  FNP-C  Pediatric Specialist  [address]  [place], [address]  Tele: [phone]

## 2021-02-13 LAB — T4: T4, Total: 7.1 ug/dL (ref 5.3–11.7)

## 2021-02-13 LAB — T4, FREE: Free T4: 1 ng/dL (ref 0.8–1.4)

## 2021-02-13 LAB — TSH: TSH: 3.06 mIU/L

## 2021-03-27 ENCOUNTER — Other Ambulatory Visit (INDEPENDENT_AMBULATORY_CARE_PROVIDER_SITE_OTHER): Payer: Self-pay | Admitting: Family

## 2021-03-27 ENCOUNTER — Other Ambulatory Visit (INDEPENDENT_AMBULATORY_CARE_PROVIDER_SITE_OTHER): Payer: Self-pay

## 2021-03-27 DIAGNOSIS — IMO0002 Reserved for concepts with insufficient information to code with codable children: Secondary | ICD-10-CM

## 2021-03-27 DIAGNOSIS — E1065 Type 1 diabetes mellitus with hyperglycemia: Secondary | ICD-10-CM

## 2021-03-27 MED ORDER — DEXCOM G6 SENSOR MISC: 5 refills | Status: DC

## 2021-03-27 MED ORDER — DEXCOM G6 TRANSMITTER MISC: 3 refills | Status: DC

## 2021-04-10 ENCOUNTER — Other Ambulatory Visit (INDEPENDENT_AMBULATORY_CARE_PROVIDER_SITE_OTHER): Payer: Self-pay | Admitting: Family

## 2021-04-10 DIAGNOSIS — E1065 Type 1 diabetes mellitus with hyperglycemia: Secondary | ICD-10-CM

## 2021-04-10 DIAGNOSIS — IMO0002 Reserved for concepts with insufficient information to code with codable children: Secondary | ICD-10-CM

## 2021-04-16 DIAGNOSIS — F4320 Adjustment disorder, unspecified: Secondary | ICD-10-CM | POA: Diagnosis not present

## 2021-04-23 DIAGNOSIS — Z794 Long term (current) use of insulin: Secondary | ICD-10-CM | POA: Diagnosis not present

## 2021-04-23 DIAGNOSIS — E109 Type 1 diabetes mellitus without complications: Secondary | ICD-10-CM | POA: Diagnosis not present

## 2021-04-23 DIAGNOSIS — F4320 Adjustment disorder, unspecified: Secondary | ICD-10-CM | POA: Diagnosis not present

## 2021-05-07 DIAGNOSIS — F4320 Adjustment disorder, unspecified: Secondary | ICD-10-CM | POA: Diagnosis not present

## 2021-05-10 ENCOUNTER — Other Ambulatory Visit (INDEPENDENT_AMBULATORY_CARE_PROVIDER_SITE_OTHER): Payer: Self-pay | Admitting: Family

## 2021-05-10 DIAGNOSIS — E063 Autoimmune thyroiditis: Secondary | ICD-10-CM

## 2021-05-15 ENCOUNTER — Ambulatory Visit (INDEPENDENT_AMBULATORY_CARE_PROVIDER_SITE_OTHER): Payer: BC Managed Care – PPO | Admitting: Family

## 2021-05-16 DIAGNOSIS — I10 Essential (primary) hypertension: Secondary | ICD-10-CM | POA: Diagnosis not present

## 2021-05-16 DIAGNOSIS — E039 Hypothyroidism, unspecified: Secondary | ICD-10-CM | POA: Diagnosis not present

## 2021-05-16 DIAGNOSIS — E1065 Type 1 diabetes mellitus with hyperglycemia: Secondary | ICD-10-CM | POA: Diagnosis not present

## 2021-05-16 DIAGNOSIS — D508 Other iron deficiency anemias: Secondary | ICD-10-CM | POA: Diagnosis not present

## 2021-05-28 DIAGNOSIS — F4320 Adjustment disorder, unspecified: Secondary | ICD-10-CM | POA: Diagnosis not present

## 2021-06-08 ENCOUNTER — Other Ambulatory Visit (INDEPENDENT_AMBULATORY_CARE_PROVIDER_SITE_OTHER): Payer: Self-pay | Admitting: Family

## 2021-06-08 DIAGNOSIS — E109 Type 1 diabetes mellitus without complications: Secondary | ICD-10-CM

## 2021-06-09 ENCOUNTER — Encounter (INDEPENDENT_AMBULATORY_CARE_PROVIDER_SITE_OTHER): Payer: Self-pay

## 2021-06-09 ENCOUNTER — Telehealth (INDEPENDENT_AMBULATORY_CARE_PROVIDER_SITE_OTHER): Payer: Self-pay

## 2021-06-09 MED ORDER — INSULIN LISPRO 100 UNIT/ML IJ SOLN: INTRAMUSCULAR | 3 refills | Status: AC

## 2021-06-09 NOTE — Telephone Encounter (Signed)
Sent in Humalog.

## 2021-06-11 ENCOUNTER — Telehealth (INDEPENDENT_AMBULATORY_CARE_PROVIDER_SITE_OTHER): Payer: Self-pay

## 2021-06-11 NOTE — Telephone Encounter (Signed)
error 

## 2021-06-16 ENCOUNTER — Telehealth (INDEPENDENT_AMBULATORY_CARE_PROVIDER_SITE_OTHER): Payer: Self-pay

## 2021-06-16 ENCOUNTER — Other Ambulatory Visit (INDEPENDENT_AMBULATORY_CARE_PROVIDER_SITE_OTHER): Payer: Self-pay

## 2021-06-16 DIAGNOSIS — E109 Type 1 diabetes mellitus without complications: Secondary | ICD-10-CM

## 2021-06-16 NOTE — Telephone Encounter (Signed)
Novolog available with out PA

## 2021-06-18 DIAGNOSIS — F4320 Adjustment disorder, unspecified: Secondary | ICD-10-CM | POA: Diagnosis not present

## 2021-07-02 DIAGNOSIS — F4320 Adjustment disorder, unspecified: Secondary | ICD-10-CM | POA: Diagnosis not present

## 2021-07-16 DIAGNOSIS — F4320 Adjustment disorder, unspecified: Secondary | ICD-10-CM | POA: Diagnosis not present

## 2021-07-30 DIAGNOSIS — F4320 Adjustment disorder, unspecified: Secondary | ICD-10-CM | POA: Diagnosis not present

## 2021-08-18 DIAGNOSIS — F4320 Adjustment disorder, unspecified: Secondary | ICD-10-CM | POA: Diagnosis not present

## 2021-08-22 DIAGNOSIS — Z794 Long term (current) use of insulin: Secondary | ICD-10-CM | POA: Diagnosis not present

## 2021-08-22 DIAGNOSIS — E109 Type 1 diabetes mellitus without complications: Secondary | ICD-10-CM | POA: Diagnosis not present

## 2021-09-01 DIAGNOSIS — F4320 Adjustment disorder, unspecified: Secondary | ICD-10-CM | POA: Diagnosis not present

## 2021-09-10 ENCOUNTER — Other Ambulatory Visit (INDEPENDENT_AMBULATORY_CARE_PROVIDER_SITE_OTHER): Payer: Self-pay | Admitting: Family

## 2021-09-10 DIAGNOSIS — E063 Autoimmune thyroiditis: Secondary | ICD-10-CM

## 2021-09-15 DIAGNOSIS — F4320 Adjustment disorder, unspecified: Secondary | ICD-10-CM | POA: Diagnosis not present

## 2021-09-22 DIAGNOSIS — F4320 Adjustment disorder, unspecified: Secondary | ICD-10-CM | POA: Diagnosis not present

## 2021-10-07 ENCOUNTER — Other Ambulatory Visit (INDEPENDENT_AMBULATORY_CARE_PROVIDER_SITE_OTHER): Payer: Self-pay | Admitting: Family

## 2021-10-08 DIAGNOSIS — F4320 Adjustment disorder, unspecified: Secondary | ICD-10-CM | POA: Diagnosis not present

## 2021-10-20 DIAGNOSIS — F4320 Adjustment disorder, unspecified: Secondary | ICD-10-CM | POA: Diagnosis not present

## 2021-10-23 ENCOUNTER — Encounter (INDEPENDENT_AMBULATORY_CARE_PROVIDER_SITE_OTHER): Payer: Self-pay

## 2021-11-03 DIAGNOSIS — F4320 Adjustment disorder, unspecified: Secondary | ICD-10-CM | POA: Diagnosis not present

## 2021-11-14 DIAGNOSIS — E109 Type 1 diabetes mellitus without complications: Secondary | ICD-10-CM | POA: Diagnosis not present

## 2021-11-14 DIAGNOSIS — Z794 Long term (current) use of insulin: Secondary | ICD-10-CM | POA: Diagnosis not present

## 2021-11-20 ENCOUNTER — Ambulatory Visit (INDEPENDENT_AMBULATORY_CARE_PROVIDER_SITE_OTHER): Payer: BC Managed Care – PPO | Admitting: Family

## 2021-11-20 ENCOUNTER — Encounter (INDEPENDENT_AMBULATORY_CARE_PROVIDER_SITE_OTHER): Payer: Self-pay | Admitting: Family

## 2021-11-20 VITALS — BP 118/80 | HR 104 | Wt 201.0 lb

## 2021-11-20 DIAGNOSIS — R809 Proteinuria, unspecified: Secondary | ICD-10-CM | POA: Diagnosis not present

## 2021-11-20 DIAGNOSIS — E10649 Type 1 diabetes mellitus with hypoglycemia without coma: Secondary | ICD-10-CM

## 2021-11-20 DIAGNOSIS — Z4681 Encounter for fitting and adjustment of insulin pump: Secondary | ICD-10-CM | POA: Diagnosis not present

## 2021-11-20 DIAGNOSIS — E1029 Type 1 diabetes mellitus with other diabetic kidney complication: Secondary | ICD-10-CM | POA: Diagnosis not present

## 2021-11-20 DIAGNOSIS — E063 Autoimmune thyroiditis: Secondary | ICD-10-CM

## 2021-11-20 DIAGNOSIS — E65 Localized adiposity: Secondary | ICD-10-CM

## 2021-11-20 DIAGNOSIS — E1065 Type 1 diabetes mellitus with hyperglycemia: Secondary | ICD-10-CM | POA: Diagnosis not present

## 2021-11-20 LAB — POCT GLUCOSE (DEVICE FOR HOME USE): Glucose Fasting, POC: 184 mg/dL — AB (ref 70–99)

## 2021-11-20 NOTE — Patient Instructions (Signed)
Basal Rates ?12AM 1.07  ?4am 1.025  ?6am 0.975  ?8am 1.02  ?9pm 1.150  ? ? ?Insulin to Carbohydrate Ratio ?12AM 10--> 13   ?4am 10   ?6am 9  ?8am 9  ?9pm 8 --> 9   ? ? ?Insulin Sensitivity Factor ?12AM 37 --> 45   ?4am 37--> 40   ?6am 30  ?8am 30  ?9pm 35 --> 40   ? ?- Please come any day other then Thursday to have labs checked.  ?

## 2021-11-20 NOTE — Progress Notes (Signed)
Pediatric Endocrinology Diabetes Consultation Follow-up Visit ? ?Elizabeth Stephenson ?01/27/2001 ?[id number] ? ?Chief Complaint: Follow-up type 1 diabetes ? ? ?Elizabeth Stephenson ? ? ?HPI: ?Elizabeth Stephenson  is a 21 y.o. female presenting for follow-up of type 1 diabetes. she is accompanied to this visit by her Mother. ? ?1. Elizabeth Stephenson was diagnosed with type 1 diabetes when she was 21 years old. She believes it was the summer of 2012. Her older brother was diagnosed with type 1 diabetes at age 505. Mom recognized weight loss and other symptoms in Elizabeth Stephenson and tested her sugar at home- it was above 300 mg/dL. She called Dr[facility] at [facility] and started her on insulin at home. She was diagnosed with hypothyroidism around the same time. She is transferring care from her PCP office to pediatric endocrinology for management of these issues.   ? ?2. Since last visit to [facility] on 01/2021, she has been well.  ? ?Reports that she has had more social anxiety recently along with multiple family issues. She has been going to counseling every 2 weeks which has been helpful. She is not currently taking any medications and feels like she is handling things well. She is looking for a new job and hopes to go to cosmetology school.  ? ?She reports diabetes care is going "good". Blood sugars are stable during the day and she has done well with carb counting. Tries to bolus before eating at most meals but still notices that she has a pronounced spike, sometimes stays high longer then 2-3 hours. Rotates pump sites every 2-3 days, mainly to abdomen. She is wearing Tslim insulin pump and Dexcom CGM. Both have been working well but she will be due for a new pump soon.  ? ? ?Concerns;  ?- having some low blood sugars in the morning.  ? ?- Taking 75 mcg of levothyroxine per day, she does not forget very often. Denies fatigue, constipation and cold intolerance.   ? ?Followed by Nephrology and currently taking Losartan daily.  ? ? ?Insulin regimen: Tslim  insulin pump  ?Basal Rates ?12AM 1.07  ?4am 1.025  ?6am 0.975  ?8am 1.02  ?9pm 1.150  ? ? ?Insulin to Carbohydrate Ratio ?12AM 10  ?4am 10   ?6am 9  ?8am 9  ?9pm 8   ? ? ?Insulin Sensitivity Factor ?12AM 37   ?4am 37  ?6am 30  ?8am 30  ?9pm 35   ? ?Target Blood Glucose ?12AM 150  ?6am 120  ?9pm 150   ?   ?   ? ? ?Hypoglycemia: Not feeling lows until under 60 now. Does not feel lows at night. No glucagon needed.  ?Insulin pump and CGM download  ? ?- Pattern of hypoglycemia between 12am-4am. They occur following boluses, rarely occur when she has not bolused for correction or carbs.  ?Med-alert ID: Not currently wearing. ?Injection sites: Abdomen (some scar tissue) and legs  ?Annual labs due: 07/2021 ?Ophthalmology due: 09/2020 ? ?  ?3. ROS: Greater than 10 systems reviewed with pertinent positives listed in HPI, otherwise neg. ?Constitutional: Sleeping well.  ?Eyes: No changes in vision. No blurry vision. Wears glasses.  ?Ears/Nose/Mouth/Throat: No difficulty swallowing. No neck pain  ?Cardiovascular: No palpitations.  ?Respiratory: No increased work of breathing. No SOB  ?Gastrointestinal: No constipation or diarrhea. No abdominal pain ?Genitourinary: No nocturia, no polyuria ?Musculoskeletal: No joint pain ?Neurologic: Normal sensation, no tremor ?Endocrine: No polydipsia.  No hyperpigmentation ?Psychiatric: Normal affect. No depression or anxiety.  ? ?Past Medical  History:   ?Past Medical History:  ?Diagnosis Date  ? Thyroid disease   ? Type 1 diabetes mellitus on insulin therapy (HCC) 05/26/2017  ? ? ?Medications:  ?Outpatient Encounter Medications as of 11/20/2021  ?Medication Sig Note  ? Continuous Blood Gluc Sensor (DEXCOM G6 SENSOR) MISC CHANGE SENSOR EVERY 10 DAYS   ? Continuous Blood Gluc Transmit (DEXCOM G6 TRANSMITTER) MISC Change transmitter every 90 days   ? insulin aspart (NOVOLOG) 100 UNIT/ML injection INJECT UP TO 300 UNITS VIA INSULIN PUMP EVERY 36 HOURS   ? levothyroxine (SYNTHROID) 75 MCG tablet TAKE  1 TABLET BY MOUTH EVERY DAY BEFORE BREAKFAST   ? losartan (COZAAR) 25 MG tablet Take 12.5 mg by mouth daily.   ? ACCU-CHEK FASTCLIX LANCETS MISC 1 each as directed by Does not apply route. Check sugar 6 x daily (Patient not taking: Reported on 05/14/2020)   ? glucagon 1 MG injection Follow package directions for low blood sugar. (Patient not taking: Reported on 02/12/2020) 02/12/2020: PRN emergencies  ? glucose blood (ACCU-CHEK GUIDE) test strip Use as instructed for 6 checks per day plus per protocol for hyper/hypoglycemia (Patient not taking: Reported on 05/14/2020)   ? ibuprofen (ADVIL) 800 MG tablet Take 1 tablet (800 mg total) by mouth every 8 (eight) hours as needed. (Patient not taking: Reported on 02/12/2020)   ? insulin aspart (NOVOLOG FLEXPEN) 100 UNIT/ML FlexPen Inject up to 50 units daily in case of pump failure (Patient not taking: Reported on 02/12/2020) 06/29/2019: Emergency use  ? Insulin Glargine (LANTUS SOLOSTAR) 100 UNIT/ML Solostar Pen Up to 50 Units at bedtime and per care plan (Patient not taking: Reported on 02/12/2020) 02/12/2020: PRN pump failure  ? insulin lispro (HUMALOG) 100 UNIT/ML injection INJECT UP TO 300 UNITS VIA INSULIN PUMP EVERY 36 HOURS (Patient not taking: Reported on 02/12/2021)   ? insulin lispro (HUMALOG) 100 UNIT/ML injection Use up to 300 units in pump every 48 hours (Patient not taking: Reported on 11/20/2021)   ? Insulin Pen Needle (INSUPEN PEN NEEDLES) 32G X 4 MM MISC BD Pen Needles- brand specific. Inject insulin via insulin pen 6 x daily (Patient not taking: Reported on 11/20/2021)   ? lidocaine-prilocaine (EMLA) cream Apply 1 application topically as needed. (Patient not taking: Reported on 02/12/2020)   ? ?No facility-administered encounter medications on file as of 11/20/2021.  ? ? ?Allergies: ?No Known Allergies ? ?Surgical History: ?Past Surgical History:  ?Procedure Laterality Date  ? APPENDECTOMY    ? LAPAROSCOPIC APPENDECTOMY N/A 06/29/2019  ? Procedure: APPENDECTOMY  LAPAROSCOPIC;  Surgeon: Karie Soda, Stephenson;  Location: [facility];  Service: General;  Laterality: N/A;  ? ? ?Family History:  ?Family History  ?Problem Relation Age of Onset  ? Hypertension Father   ?Older brother has Type 1 Diabetes.  ?  ?Social History: ?Lives with: father and siblings.  ?Starting cosmetology school  ? ?Physical Exam:  ?Vitals:  ? 11/20/21 1426  ?BP: 118/80  ?Pulse: (!) 104  ?Weight: 201 lb (91.2 kg)  ? ? ? ?BP 118/80   Pulse (!) 104   Wt 201 lb (91.2 kg)   BMI 35.35 kg/m?  ?Body mass index: body mass index is 35.35 kg/m?Marland Kitchen ?Growth percentile SmartLinks can only be used for patients less than 34 years old. ? ?Ht Readings from Last 3 Encounters:  ?11/16/19 5' 3.23" (1.606 m) (34 %, Z= -0.42)*  ?08/18/19 5' 2.99" (1.6 m) (31 %, Z= -0.51)*  ?06/29/19 5\' 3"  (1.6 m) (31 %, Z= -0.50)*  ? ?*  Growth percentiles are based on CDC (Girls, 2-20 Years) data.  ? ?Wt Readings from Last 3 Encounters:  ?11/20/21 201 lb (91.2 kg)  ?02/12/21 194 lb 9.6 oz (88.3 kg)  ?11/12/20 191 lb (86.6 kg)  ? ? ?Physical exam ?General: Well developed, well nourished female in no acute distress.   ?Head: Normocephalic, atraumatic.   ?Eyes:  Pupils equal and round. EOMI.   Sclera white.  No eye drainage.   ?Ears/Nose/Mouth/Throat: Nares patent, no nasal drainage.  Normal dentition, mucous membranes moist.   ?Neck: supple, no cervical lymphadenopathy, no thyromegaly ?Cardiovascular: regular rate, normal S1/S2, no murmurs ?Respiratory: No increased work of breathing.  Lungs clear to auscultation bilaterally.  No wheezes. ?Abdomen: soft, nontender, nondistended. No appreciable masses  ?Extremities: warm, well perfused, cap refill < 2 sec.   ?Musculoskeletal: Normal muscle mass.  Normal strength ?Skin: warm, dry.  No rash or lesions. + lipohypertrophy to abdomen  ?Neurologic: alert and oriented, normal speech, no tremor ? ? ?Labs: ?Last hemoglobin A1c: 01/2021 ? ?Results for orders placed or performed in visit on 11/20/21  ?POCT Glucose  (Device for Home Use)  ?Result Value Ref Range  ? Glucose Fasting, POC 184 (A) 70 - 99 mg/dL  ? POC Glucose    ? ? ? ? ?Assessment/Plan: ?Tiasia is a 21 y.o. female with type 1 diabetes in improving control

## 2021-11-26 DIAGNOSIS — F4320 Adjustment disorder, unspecified: Secondary | ICD-10-CM | POA: Diagnosis not present

## 2021-12-03 DIAGNOSIS — F4320 Adjustment disorder, unspecified: Secondary | ICD-10-CM | POA: Diagnosis not present

## 2021-12-04 ENCOUNTER — Other Ambulatory Visit (INDEPENDENT_AMBULATORY_CARE_PROVIDER_SITE_OTHER): Payer: Self-pay | Admitting: Family

## 2021-12-04 DIAGNOSIS — E063 Autoimmune thyroiditis: Secondary | ICD-10-CM

## 2021-12-10 DIAGNOSIS — F4320 Adjustment disorder, unspecified: Secondary | ICD-10-CM | POA: Diagnosis not present

## 2021-12-11 DIAGNOSIS — E109 Type 1 diabetes mellitus without complications: Secondary | ICD-10-CM | POA: Diagnosis not present

## 2021-12-11 DIAGNOSIS — E039 Hypothyroidism, unspecified: Secondary | ICD-10-CM | POA: Diagnosis not present

## 2021-12-11 DIAGNOSIS — R809 Proteinuria, unspecified: Secondary | ICD-10-CM | POA: Diagnosis not present

## 2021-12-31 ENCOUNTER — Other Ambulatory Visit (INDEPENDENT_AMBULATORY_CARE_PROVIDER_SITE_OTHER): Payer: Self-pay

## 2021-12-31 ENCOUNTER — Encounter (INDEPENDENT_AMBULATORY_CARE_PROVIDER_SITE_OTHER): Payer: Self-pay

## 2021-12-31 DIAGNOSIS — E1029 Type 1 diabetes mellitus with other diabetic kidney complication: Secondary | ICD-10-CM

## 2022-02-20 ENCOUNTER — Ambulatory Visit (INDEPENDENT_AMBULATORY_CARE_PROVIDER_SITE_OTHER): Payer: BC Managed Care – PPO | Admitting: Family

## 2022-02-28 ENCOUNTER — Encounter (INDEPENDENT_AMBULATORY_CARE_PROVIDER_SITE_OTHER): Payer: Self-pay

## 2022-03-02 ENCOUNTER — Other Ambulatory Visit (INDEPENDENT_AMBULATORY_CARE_PROVIDER_SITE_OTHER): Payer: Self-pay

## 2022-03-02 MED ORDER — DEXCOM G6 TRANSMITTER MISC: 3 refills | Status: DC

## 2022-03-02 MED ORDER — DEXCOM G6 SENSOR MISC: 5 refills | Status: DC

## 2022-03-04 ENCOUNTER — Other Ambulatory Visit (INDEPENDENT_AMBULATORY_CARE_PROVIDER_SITE_OTHER): Payer: Self-pay | Admitting: Family

## 2022-03-04 DIAGNOSIS — E063 Autoimmune thyroiditis: Secondary | ICD-10-CM

## 2022-03-26 ENCOUNTER — Ambulatory Visit (INDEPENDENT_AMBULATORY_CARE_PROVIDER_SITE_OTHER): Payer: 59 | Admitting: Family

## 2022-04-09 ENCOUNTER — Encounter (INDEPENDENT_AMBULATORY_CARE_PROVIDER_SITE_OTHER): Payer: Self-pay

## 2022-04-09 ENCOUNTER — Other Ambulatory Visit (INDEPENDENT_AMBULATORY_CARE_PROVIDER_SITE_OTHER): Payer: Self-pay

## 2022-05-06 ENCOUNTER — Ambulatory Visit (INDEPENDENT_AMBULATORY_CARE_PROVIDER_SITE_OTHER): Payer: BC Managed Care – PPO | Admitting: Family

## 2022-06-02 ENCOUNTER — Other Ambulatory Visit (INDEPENDENT_AMBULATORY_CARE_PROVIDER_SITE_OTHER): Payer: Self-pay | Admitting: Family

## 2022-06-02 DIAGNOSIS — E063 Autoimmune thyroiditis: Secondary | ICD-10-CM

## 2022-06-02 NOTE — Telephone Encounter (Signed)
Refill sent to pharmacy.   

## 2022-06-23 ENCOUNTER — Other Ambulatory Visit (INDEPENDENT_AMBULATORY_CARE_PROVIDER_SITE_OTHER): Payer: Self-pay

## 2022-06-23 ENCOUNTER — Encounter (INDEPENDENT_AMBULATORY_CARE_PROVIDER_SITE_OTHER): Payer: Self-pay

## 2022-06-23 DIAGNOSIS — E109 Type 1 diabetes mellitus without complications: Secondary | ICD-10-CM

## 2022-06-23 MED ORDER — INSULIN ASPART 100 UNIT/ML IJ SOLN: INTRAMUSCULAR | 4 refills | Status: DC

## 2022-06-30 ENCOUNTER — Other Ambulatory Visit (INDEPENDENT_AMBULATORY_CARE_PROVIDER_SITE_OTHER): Payer: Self-pay

## 2022-06-30 DIAGNOSIS — E109 Type 1 diabetes mellitus without complications: Secondary | ICD-10-CM

## 2022-06-30 MED ORDER — DEXCOM G6 TRANSMITTER MISC: 3 refills | Status: DC

## 2022-06-30 MED ORDER — INSULIN ASPART 100 UNIT/ML IJ SOLN: INTRAMUSCULAR | 4 refills | Status: AC

## 2022-07-22 ENCOUNTER — Other Ambulatory Visit (INDEPENDENT_AMBULATORY_CARE_PROVIDER_SITE_OTHER): Payer: Self-pay

## 2022-07-22 MED ORDER — DEXCOM G6 SENSOR MISC: 5 refills | Status: AC

## 2022-07-22 MED ORDER — DEXCOM G6 TRANSMITTER MISC: 3 refills | Status: AC

## 2022-08-10 ENCOUNTER — Telehealth (INDEPENDENT_AMBULATORY_CARE_PROVIDER_SITE_OTHER): Payer: Self-pay | Admitting: Family

## 2022-08-10 ENCOUNTER — Encounter (INDEPENDENT_AMBULATORY_CARE_PROVIDER_SITE_OTHER): Payer: Self-pay

## 2022-08-10 NOTE — Telephone Encounter (Signed)
  Name of who is calling: [place] contact number: [phone] Fax is [phone]  Provider they see: Hedda Slade  Reason for call: [place] is calling in regards to Dexcom. Asking for blod glucose value and A1C value.

## 2022-08-10 NOTE — Telephone Encounter (Signed)
Faxed.

## 2022-08-18 ENCOUNTER — Telehealth (INDEPENDENT_AMBULATORY_CARE_PROVIDER_SITE_OTHER): Payer: Self-pay | Admitting: Family

## 2022-08-18 NOTE — Telephone Encounter (Signed)
  Name of who is calling: Augustin Coupe from [facility] contact number: [phone] ext [phone] Fax [phone] Provider they see: Hermenia Bers  Reason for call: Elizabeth Stephenson is calling because they need a clear date on the detailed written order form for her CGM. She is calling to check the status of that.

## 2022-08-18 NOTE — Telephone Encounter (Signed)
Faxed

## 2022-08-26 ENCOUNTER — Telehealth (INDEPENDENT_AMBULATORY_CARE_PROVIDER_SITE_OTHER): Payer: Self-pay | Admitting: Family

## 2022-08-26 NOTE — Telephone Encounter (Signed)
  Name of who is calling: Alphonzo Severance  Caller's Relationship to Patient: Case manager  Best contact number: [phone] ext. [phone]  Provider they see: Hedda Slade  Reason for call: Case manager needs spenser's signature on a detailed written order. It was sent from the fax number is [phone].     PRESCRIPTION REFILL ONLY  Name of prescription:  Pharmacy:

## 2022-08-27 ENCOUNTER — Ambulatory Visit (INDEPENDENT_AMBULATORY_CARE_PROVIDER_SITE_OTHER): Payer: 59 | Admitting: Family

## 2022-08-27 ENCOUNTER — Encounter (INDEPENDENT_AMBULATORY_CARE_PROVIDER_SITE_OTHER): Payer: Self-pay | Admitting: Family

## 2022-08-27 VITALS — BP 130/82 | HR 114 | Wt 194.6 lb

## 2022-08-27 DIAGNOSIS — E1065 Type 1 diabetes mellitus with hyperglycemia: Secondary | ICD-10-CM

## 2022-08-27 DIAGNOSIS — E1029 Type 1 diabetes mellitus with other diabetic kidney complication: Secondary | ICD-10-CM | POA: Diagnosis not present

## 2022-08-27 DIAGNOSIS — E063 Autoimmune thyroiditis: Secondary | ICD-10-CM

## 2022-08-27 DIAGNOSIS — Z4681 Encounter for fitting and adjustment of insulin pump: Secondary | ICD-10-CM

## 2022-08-27 DIAGNOSIS — R809 Proteinuria, unspecified: Secondary | ICD-10-CM | POA: Diagnosis not present

## 2022-08-27 LAB — POCT GLYCOSYLATED HEMOGLOBIN (HGB A1C): Hemoglobin A1C: 10.1 % — AB (ref 4.0–5.6)

## 2022-08-27 LAB — POCT GLUCOSE (DEVICE FOR HOME USE): Glucose Fasting, POC: 291 mg/dL — AB (ref 70–99)

## 2022-08-27 NOTE — Patient Instructions (Addendum)
It was a pleasure seeing you in clinic today. Please do not hesitate to contact me if you have questions or concerns.   Please sign up for MyChart. This is a communication tool that allows you to send an email directly to me. This can be used for questions, prescriptions and blood sugar reports. We will also release labs to you with instructions on MyChart. Please do not use MyChart if you need immediate or emergency assistance. Ask our wonderful front office staff if you need assistance.   Basal Rates 12AM 1.07  4am 1.025 --> 0.975   6am 0.975--> 0.925  8am 1.02  9pm 1.150  24.9  units per day   Insulin to Carbohydrate Ratio 12AM 13   4am 12  6am 10  8am 9  9pm 9     Insulin Sensitivity Factor 12AM 45   4am 40   6am 30  8am 30  9pm 40     Target Blood Glucose 12AM 110

## 2022-08-27 NOTE — Progress Notes (Signed)
Pediatric Endocrinology Diabetes Consultation Follow-up Visit  Elizabeth Stephenson April 22, 2001 [id number]  Chief Complaint: Follow-up type 1 diabetes   [name], MD   HPI: Elizabeth Stephenson  is a 22 y.o. female presenting for follow-up of type 1 diabetes. she is accompanied to this visit by her Mother.  1. Elizabeth Stephenson was diagnosed with type 1 diabetes when she was 22 years old. She believes it was the summer of 2012. Her older brother was diagnosed with type 1 diabetes at age 5. Mom recognized weight loss and other symptoms in Elizabeth Stephenson and tested her sugar at home- it was above 300 mg/dL. She called Dr. Tiburcio Pea at [facility] and started her on insulin at home. She was diagnosed with hypothyroidism around the same time. She is transferring care from her PCP office to pediatric endocrinology for management of these issues.    2. Since last visit to [facility] on 11/2021, she has been well. She was instructed to follow up in 3 months but canceled appointment in August, September and October. She will establish care with adult endocrinology soon as well.   She continues counseling and reports her anxiety has been about the same.   Has not been able to wear Dexcom CGM since December because they would not send her transmitters. She has now upgraded her pump and will plan to start Dexcom G7. When she is not wearing her CGM she tries to check her blood sugar but reports frequently forgetting. She tries to bolus for all carb intake, no long snacking at night. Hypoglycemia has been rare but she does report a few hypoglycemic episodes between 6-8am.   - Taking 75 mcg of levothyroxine per day, she does not forget very often. Denies fatigue, constipation and cold intolerance.    Followed by Nephrology and currently taking Losartan daily. Will follow up as needed.   Concerns;  - Elizabeth Stephenson Reprogrammed new insulin pump in Jan 2024. Settings entered incorrectly   Basal 1.05 Carb ratio: 1: 9  ICR: 1: 30  Target 110    Insulin regimen: Tslim insulin pump  Basal Rates 12AM 1.07  4am 1.025   6am 0.975  8am 1.02  9pm 1.150  24.9  units per day   Insulin to Carbohydrate Ratio 12AM 13   4am 12  6am 10  8am 9  9pm 9     Insulin Sensitivity Factor 12AM 45   4am 40   6am 30  8am 30  9pm 40     Target Blood Glucose 12AM 110                 Hypoglycemia: Not feeling lows until under 60 now. Does not feel lows at night. No glucagon needed.  Insulin pump and CGM download   Med-alert ID: Not currently wearing. Injection sites: Abdomen (some scar tissue) and legs  Annual labs due: Ordered  Ophthalmology due: 2024    3. ROS: Greater than 10 systems reviewed with pertinent positives listed in HPI, otherwise neg. Constitutional: Sleeping well.  Eyes: No changes in vision. No blurry vision. Wears glasses.  Ears/Nose/Mouth/Throat: No difficulty swallowing. No neck pain  Cardiovascular: No palpitations.  Respiratory: No increased work of breathing. No SOB  Gastrointestinal: No constipation or diarrhea. No abdominal pain Genitourinary: No nocturia, no polyuria Musculoskeletal: No joint pain Neurologic: Normal sensation, no tremor Endocrine: No polydipsia.  No hyperpigmentation Psychiatric: Normal affect. + anxiety, currently in therapy.   Past Medical History:   Past Medical History:  Diagnosis Date   Thyroid  disease    Type 1 diabetes mellitus on insulin therapy ([place]) 05/26/2017    Medications:  Outpatient Encounter Medications as of 08/27/2022  Medication Sig Note   ACCU-CHEK FASTCLIX LANCETS MISC 1 each as directed by Does not apply route. Check sugar 6 x daily (Patient not taking: Reported on 05/14/2020)    Continuous Blood Gluc Sensor (DEXCOM G6 SENSOR) MISC CHANGE SENSOR EVERY 10 DAYS    Continuous Blood Gluc Transmit (DEXCOM G6 TRANSMITTER) MISC Change transmitter every 90 days    glucagon 1 MG injection Follow package directions for low blood sugar. (Patient not taking:  Reported on 02/12/2020) 02/12/2020: PRN emergencies   glucose blood (ACCU-CHEK GUIDE) test strip Use as instructed for 6 checks per day plus per protocol for hyper/hypoglycemia (Patient not taking: Reported on 05/14/2020)    ibuprofen (ADVIL) 800 MG tablet Take 1 tablet (800 mg total) by mouth every 8 (eight) hours as needed. (Patient not taking: Reported on 02/12/2020)    insulin aspart (NOVOLOG FLEXPEN) 100 UNIT/ML FlexPen Inject up to 50 units daily in case of pump failure (Patient not taking: Reported on 02/12/2020) 06/29/2019: Emergency use   insulin aspart (NOVOLOG) 100 UNIT/ML injection INJECT UP TO 300 UNITS VIA INSULIN PUMP EVERY 36 HOURS    Insulin Glargine (LANTUS SOLOSTAR) 100 UNIT/ML Solostar Pen Up to 50 Units at bedtime and per care plan (Patient not taking: Reported on 02/12/2020) 02/12/2020: PRN pump failure   insulin lispro (HUMALOG) 100 UNIT/ML injection INJECT UP TO 300 UNITS VIA INSULIN PUMP EVERY 36 HOURS (Patient not taking: Reported on 02/12/2021)    insulin lispro (HUMALOG) 100 UNIT/ML injection Use up to 300 units in pump every 48 hours (Patient not taking: Reported on 11/20/2021)    Insulin Pen Needle (INSUPEN PEN NEEDLES) 32G X 4 MM MISC BD Pen Needles- brand specific. Inject insulin via insulin pen 6 x daily (Patient not taking: Reported on 11/20/2021)    levothyroxine (SYNTHROID) 75 MCG tablet TAKE 1 TABLET BY MOUTH EVERY DAY BEFORE BREAKFAST    lidocaine-prilocaine (EMLA) cream Apply 1 application topically as needed. (Patient not taking: Reported on 02/12/2020)    losartan (COZAAR) 25 MG tablet Take 12.5 mg by mouth daily.    No facility-administered encounter medications on file as of 08/27/2022.    Allergies: No Known Allergies  Surgical History: Past Surgical History:  Procedure Laterality Date   APPENDECTOMY     LAPAROSCOPIC APPENDECTOMY N/A 06/29/2019   Procedure: APPENDECTOMY LAPAROSCOPIC;  Surgeon: Michael Boston, MD;  Location: [facility];  Service: General;  Laterality:  N/A;    Family History:  Family History  Problem Relation Age of Onset   Hypertension Father   Older brother has Type 1 Diabetes.    Social History: Lives with: father and siblings.  Starting cosmetology school   Physical Exam:  There were no vitals filed for this visit.    There were no vitals taken for this visit. Body mass index: body mass index is unknown because there is no height or weight on file. Growth %ile SmartLinks can only be used for patients less than 80 years old.  Ht Readings from Last 3 Encounters:  11/16/19 5' 3.23" (1.606 m) (34 %, Z= -0.42)*  08/18/19 5' 2.99" (1.6 m) (31 %, Z= -0.51)*  06/29/19 5\' 3"  (1.6 m) (31 %, Z= -0.50)*   * Growth percentiles are based on CDC (Girls, 2-20 Years) data.   Wt Readings from Last 3 Encounters:  11/20/21 201 lb (91.2 kg)  02/12/21 194  lb 9.6 oz (88.3 kg)  11/12/20 191 lb (86.6 kg)    Physical exam General: Well developed, well nourished female in no acute distress.   Head: Normocephalic, atraumatic.   Eyes:  Pupils equal and round. EOMI.   Sclera white.  No eye drainage.   Ears/Nose/Mouth/Throat: Nares patent, no nasal drainage.  Normal dentition, mucous membranes moist.   Neck: supple, no cervical lymphadenopathy, no thyromegaly Cardiovascular: regular rate, normal S1/S2, no murmurs Respiratory: No increased work of breathing.  Lungs clear to auscultation bilaterally.  No wheezes. Abdomen: soft, nontender, nondistended. No appreciable masses  Extremities: warm, well perfused, cap refill < 2 sec.   Musculoskeletal: Normal muscle mass.  Normal strength Skin: warm, dry.  No rash or lesions. Neurologic: alert and oriented, normal speech, no tremor    Labs: Last hemoglobin A1c:   Results for orders placed or performed in visit on 11/20/21  POCT Glucose (Device for Home Use)  Result Value Ref Range   Glucose Fasting, POC 184 (A) 70 - 99 mg/dL   POC Glucose        Assessment/Plan: Elizabeth Stephenson is a 22 y.o.  female with type 1 diabetes on Tandem TSlim insulin pump. She has more hyperglycemia due to a combination of not having CGM therapy and entering incorrect pump settings. She has a pattern of hypoglycemia between 6-8am, will decrease basal rates. Hemoglobin A1c is 10.1% which is higher then ADA goal of <7%. She is clinically euthyroid on 75 mcg of levothyroxine daily.   1-2. DM w/o complication type I, uncontrolled (HCC)/Hyperglycemia - Reviewed insulin pump and CGM download. Discussed trends and patterns.  - Rotate pump sites to prevent scar tissue.  - bolus 15 minutes prior to eating to limit blood sugar spikes.  - Reviewed carb counting and importance of accurate carb counting.  - Discussed signs and symptoms of hypoglycemia. Always have glucose available.  - POCT glucose and hemoglobin A1c  - Reviewed growth chart.  - Referral placed to [facility] for adult endocrinology. Discussed importance of scheduling.  -  Orders Placed This Encounter  Procedures   COMPLETE METABOLIC PANEL WITH GFR   Lipid panel   Microalbumin / creatinine urine ratio   T4, free   TSH   Ambulatory referral to Endocrinology    Referral Priority:   Routine    Referral Type:   Consultation    Referral Reason:   Specialty Services Required    Number of Visits Requested:   1   POCT glycosylated hemoglobin (Hb A1C)   POCT Glucose (Device for Home Use)   COLLECTION CAPILLARY BLOOD SPECIMEN    3. Hypothyroidism, acquired, autoimmune -  75 mcg of levothyroxine per day  - TSH, FT4 and T4 ordered  Orders Placed This Encounter  Procedures   COMPLETE METABOLIC PANEL WITH GFR   Lipid panel   Microalbumin / creatinine urine ratio   T4, free   TSH   Ambulatory referral to Endocrinology    Referral Priority:   Routine    Referral Type:   Consultation    Referral Reason:   Specialty Services Required    Number of Visits Requested:   1   POCT glycosylated hemoglobin (Hb A1C)   POCT Glucose (Device for Home Use)    COLLECTION CAPILLARY BLOOD SPECIMEN     4. Insulin pump titration  - Insulin pump reprogrammed today.   Basal Rates 12AM 1.07  4am 1.025 --> 0.975   6am 0.975--> 0.925  8am 1.02  9pm 1.150  24.9  units per day   Insulin to Carbohydrate Ratio 12AM 13   4am 12  6am 10  8am 9  9pm 9     Insulin Sensitivity Factor 12AM 45   4am 40   6am 30  8am 30  9pm 40     Target Blood Glucose 12AM 110                 5 Microalbuminuria  - Followed by neprhology  - Continue Losartan  -Stressed importance of good glucose control.   Follow-up:   3 months.    LOS: >40  spent today reviewing the medical chart, counseling the patient/family, and documenting today's visit.    When a patient is on insulin, intensive monitoring of blood glucose levels is necessary to avoid hyperglycemia and hypoglycemia. Severe hyperglycemia/hypoglycemia can lead to hospital admissions and be life threatening.     Hermenia Bers,  FNP-C  Pediatric Specialist  [address] [place]  [place], [address]  Tele: [phone]

## 2022-08-31 ENCOUNTER — Telehealth (INDEPENDENT_AMBULATORY_CARE_PROVIDER_SITE_OTHER): Payer: Self-pay | Admitting: Family

## 2022-08-31 ENCOUNTER — Other Ambulatory Visit (INDEPENDENT_AMBULATORY_CARE_PROVIDER_SITE_OTHER): Payer: Self-pay | Admitting: Family

## 2022-08-31 DIAGNOSIS — E063 Autoimmune thyroiditis: Secondary | ICD-10-CM

## 2022-08-31 NOTE — Telephone Encounter (Signed)
  Name of who is calling: [facility]   Caller's Relationship to Patient:  Best contact number: [phone] ext [phone]  Provider they see: Hedda Slade  Reason for call: requesting call back in reference to patient      [place]  Name of prescription:  Pharmacy:

## 2022-09-03 ENCOUNTER — Encounter (INDEPENDENT_AMBULATORY_CARE_PROVIDER_SITE_OTHER): Payer: Self-pay

## 2022-09-04 ENCOUNTER — Telehealth (INDEPENDENT_AMBULATORY_CARE_PROVIDER_SITE_OTHER): Payer: Self-pay | Admitting: Family

## 2022-09-04 NOTE — Telephone Encounter (Signed)
  Name of who is calling: Denzil Hughes  Best contact number: [phone] ext [phone]  Provider they see: Hedda Slade  Reason for call: Denzil Hughes is calling following up on a form they needed corrected.

## 2022-09-04 NOTE — Telephone Encounter (Signed)
Spoke with [facility]. The paperwork they are asking for has been fixed and faxed several times. I went in on [facility] to resend the order.

## 2022-09-04 NOTE — Telephone Encounter (Signed)
  Name of who is calling: [facility]   Caller's Relationship to Patient:  Best contact number: [phone] ext [phone]  Provider they see: Hedda Slade  Reason for call: requesting a call back in reference to patient      [place]  Name of prescription:  Pharmacy:

## 2022-09-18 ENCOUNTER — Encounter (INDEPENDENT_AMBULATORY_CARE_PROVIDER_SITE_OTHER): Payer: Self-pay

## 2022-09-23 ENCOUNTER — Encounter (INDEPENDENT_AMBULATORY_CARE_PROVIDER_SITE_OTHER): Payer: Self-pay

## 2022-09-23 LAB — COMPLETE METABOLIC PANEL WITH GFR
AG Ratio: 1.1 (calc) (ref 1.0–2.5)
ALT: 15 U/L (ref 6–29)
AST: 11 U/L (ref 10–30)
Albumin: 3.9 g/dL (ref 3.6–5.1)
Alkaline phosphatase (APISO): 84 U/L (ref 31–125)
BUN: 8 mg/dL (ref 7–25)
CO2: 26 mmol/L (ref 20–32)
Calcium: 9.6 mg/dL (ref 8.6–10.2)
Chloride: 102 mmol/L (ref 98–110)
Creat: 0.66 mg/dL (ref 0.50–0.96)
Globulin: 3.5 g/dL (calc) (ref 1.9–3.7)
Glucose, Bld: 171 mg/dL — ABNORMAL HIGH (ref 65–139)
Potassium: 4 mmol/L (ref 3.5–5.3)
Sodium: 137 mmol/L (ref 135–146)
Total Bilirubin: 0.3 mg/dL (ref 0.2–1.2)
Total Protein: 7.4 g/dL (ref 6.1–8.1)
eGFR: 127 mL/min/{1.73_m2} (ref >=60)

## 2022-09-23 LAB — MICROALBUMIN / CREATININE URINE RATIO
Creatinine, Urine: 107 mg/dL (ref 20–275)
Microalb Creat Ratio: 132 mcg/mg creat — ABNORMAL HIGH (ref <30)
Microalb, Ur: 14.1 mg/dL

## 2022-09-23 LAB — LIPID PANEL
Cholesterol: 200 mg/dL — ABNORMAL HIGH (ref <200)
HDL: 46 mg/dL — ABNORMAL LOW (ref >=50)
LDL Cholesterol (Calc): 135 mg/dL (calc) — ABNORMAL HIGH
Non-HDL Cholesterol (Calc): 154 mg/dL (calc) — ABNORMAL HIGH (ref <130)
Total CHOL/HDL Ratio: 4.3 (calc) (ref <5.0)
Triglycerides: 93 mg/dL (ref <150)

## 2022-09-23 LAB — TSH: TSH: 12.1 mIU/L — ABNORMAL HIGH

## 2022-09-23 LAB — T4, FREE: Free T4: 1.3 ng/dL (ref 0.8–1.8)

## 2022-09-25 ENCOUNTER — Other Ambulatory Visit (INDEPENDENT_AMBULATORY_CARE_PROVIDER_SITE_OTHER): Payer: Self-pay | Admitting: Family

## 2022-09-25 MED ORDER — LEVOTHYROXINE SODIUM 100 MCG PO TABS: 100.0000 ug | ORAL_TABLET | Freq: Every day | ORAL | 0 refills | Status: DC

## 2022-11-23 ENCOUNTER — Encounter (INDEPENDENT_AMBULATORY_CARE_PROVIDER_SITE_OTHER): Payer: Self-pay

## 2022-12-04 ENCOUNTER — Encounter (INDEPENDENT_AMBULATORY_CARE_PROVIDER_SITE_OTHER): Payer: Self-pay | Admitting: Family

## 2022-12-04 ENCOUNTER — Ambulatory Visit (INDEPENDENT_AMBULATORY_CARE_PROVIDER_SITE_OTHER): Payer: 59 | Admitting: Family

## 2022-12-04 VITALS — BP 118/74 | HR 82 | Wt 208.4 lb

## 2022-12-04 DIAGNOSIS — E1029 Type 1 diabetes mellitus with other diabetic kidney complication: Secondary | ICD-10-CM | POA: Diagnosis not present

## 2022-12-04 DIAGNOSIS — Z9641 Presence of insulin pump (external) (internal): Secondary | ICD-10-CM

## 2022-12-04 DIAGNOSIS — R809 Proteinuria, unspecified: Secondary | ICD-10-CM

## 2022-12-04 DIAGNOSIS — N926 Irregular menstruation, unspecified: Secondary | ICD-10-CM

## 2022-12-04 DIAGNOSIS — E1065 Type 1 diabetes mellitus with hyperglycemia: Secondary | ICD-10-CM

## 2022-12-04 DIAGNOSIS — E063 Autoimmune thyroiditis: Secondary | ICD-10-CM

## 2022-12-04 LAB — POCT GLUCOSE (DEVICE FOR HOME USE): POC Glucose: 177 mg/dl — AB (ref 70–99)

## 2022-12-04 LAB — POCT GLYCOSYLATED HEMOGLOBIN (HGB A1C): Hemoglobin A1C: 6.6 % — AB (ref 4.0–5.6)

## 2022-12-04 NOTE — Progress Notes (Addendum)
Pediatric Endocrinology Diabetes Consultation Follow-up Visit  Elizabeth Stephenson 2000-12-26 [id number]  Chief Complaint: Follow-up type 1 diabetes   Elizabeth Blamer, MD   HPI: Elizabeth Stephenson  is a 22 y.o. female presenting for follow-up of type 1 diabetes. she is accompanied to this visit by her Mother.  1. Elizabeth Stephenson was diagnosed with type 1 diabetes when she was 22 years old. She believes it was the summer of 2012. Her older brother was diagnosed with type 1 diabetes at age 105. Mom recognized weight loss and other symptoms in Elizabeth Stephenson and tested her sugar at home- it was above 300 mg/dL. She called Dr. Tiburcio Pea at [facility] and started her on insulin at home. She was diagnosed with hypothyroidism around the same time. She is transferring care from her PCP office to pediatric endocrinology for management of these issues.    2. Since last visit to [facility] on 08/2022, she has been well.   She is starting Cosmetology school in the fall. She has been staying active going on walks and doing pilates. She has made an appointment to start seeing adult endocrinology.   Using tandem TSlim insulin pump and dexcom CGM. She had a pump malfunction and had to get a replacement since her last visit. Occasionally has failed insulin pump sites. She boluses before eating most of the time. Does well with carb counting but acknowledges she has missed some boluses at meals due to stress from life circumstances. Low blood sugars do not occur often, she had one low blood sugar that was difficult to bring up. No glucagon needed.   - Taking 100 mcg of levothyroxine per day. No missed doses. No fatigue, constipation or cold intolerance.   - Reports having irregular menstrual cycles recently. She has started taking insotil which she reports is suppose to help with insulin resistance. She is considering going to see OBGYN.   - She is taking 50 mg of Losartan. Follow by nephrology.   Insulin regimen: Tslim insulin pump  Basal  Rates 12AM 1.10  4am 1.025  6am 0.975  8am 1.02  9pm 1.150  24.9  units per day   Insulin to Carbohydrate Ratio 12AM 13   4am 12  6am 10  8am 9  9pm 9     Insulin Sensitivity Factor 12AM 45   4am 40   6am 30  8am 30  9pm 40     Target Blood Glucose 12AM 110                Target Blood Glucose 12AM 110                 Hypoglycemia: Not feeling lows until under 60 now. Does not feel lows at night. No glucagon needed.  Insulin pump and CGM download    Med-alert ID: Not currently wearing. Injection sites: Abdomen (some scar tissue) and legs  Annual labs due: 07/2023 Ophthalmology due: 11/20242024    3. ROS: Greater than 10 systems reviewed with pertinent positives listed in HPI, otherwise neg. Constitutional: Sleeping well. +14 lbs weight gain   Eyes: No changes in vision. No blurry vision. Wears glasses.  Ears/Nose/Mouth/Throat: No difficulty swallowing. No neck pain  Cardiovascular: No palpitations.  Respiratory: No increased work of breathing. No SOB  Gastrointestinal: No constipation or diarrhea. No abdominal pain Genitourinary: No nocturia, no polyuria Musculoskeletal: No joint pain Neurologic: Normal sensation, no tremor Endocrine: No polydipsia.  No hyperpigmentation Psychiatric: Normal affect. + anxiety, currently in therapy.   Past Medical  History:   Past Medical History:  Diagnosis Date   Thyroid disease    Type 1 diabetes mellitus on insulin therapy (HCC) 05/26/2017    Medications:  Outpatient Encounter Medications as of 12/04/2022  Medication Sig Note   insulin aspart (NOVOLOG) 100 UNIT/ML injection INJECT UP TO 300 UNITS VIA INSULIN PUMP EVERY 36 HOURS    levothyroxine (SYNTHROID) 100 MCG tablet Take 1 tablet (100 mcg total) by mouth daily.    losartan (COZAAR) 25 MG tablet Take 12.5 mg by mouth daily.    ACCU-CHEK FASTCLIX LANCETS MISC 1 each as directed by Does not apply route. Check sugar 6 x daily (Patient not taking: Reported on  05/14/2020)    Continuous Blood Gluc Sensor (DEXCOM G6 SENSOR) MISC CHANGE SENSOR EVERY 10 DAYS (Patient not taking: Reported on 12/04/2022)    Continuous Blood Gluc Transmit (DEXCOM G6 TRANSMITTER) MISC Change transmitter every 90 days (Patient not taking: Reported on 12/04/2022)    glucagon 1 MG injection Follow package directions for low blood sugar. (Patient not taking: Reported on 02/12/2020) 02/12/2020: PRN emergencies   glucose blood (ACCU-CHEK GUIDE) test strip Use as instructed for 6 checks per day plus per protocol for hyper/hypoglycemia (Patient not taking: Reported on 05/14/2020)    ibuprofen (ADVIL) 800 MG tablet Take 1 tablet (800 mg total) by mouth every 8 (eight) hours as needed. (Patient not taking: Reported on 02/12/2020)    insulin aspart (NOVOLOG FLEXPEN) 100 UNIT/ML FlexPen Inject up to 50 units daily in case of pump failure (Patient not taking: Reported on 02/12/2020) 06/29/2019: Emergency use   Insulin Glargine (LANTUS SOLOSTAR) 100 UNIT/ML Solostar Pen Up to 50 Units at bedtime and per care plan (Patient not taking: Reported on 02/12/2020) 02/12/2020: PRN pump failure   insulin lispro (HUMALOG) 100 UNIT/ML injection INJECT UP TO 300 UNITS VIA INSULIN PUMP EVERY 36 HOURS (Patient not taking: Reported on 02/12/2021)    insulin lispro (HUMALOG) 100 UNIT/ML injection Use up to 300 units in pump every 48 hours (Patient not taking: Reported on 11/20/2021)    Insulin Pen Needle (INSUPEN PEN NEEDLES) 32G X 4 MM MISC BD Pen Needles- brand specific. Inject insulin via insulin pen 6 x daily (Patient not taking: Reported on 11/20/2021)    lidocaine-prilocaine (EMLA) cream Apply 1 application topically as needed. (Patient not taking: Reported on 02/12/2020)    No facility-administered encounter medications on file as of 12/04/2022.    Allergies: No Known Allergies  Surgical History: Past Surgical History:  Procedure Laterality Date   APPENDECTOMY     LAPAROSCOPIC APPENDECTOMY N/A 06/29/2019    Procedure: APPENDECTOMY LAPAROSCOPIC;  Surgeon: Karie Soda, MD;  Location: [facility];  Service: General;  Laterality: N/A;    Family History:  Family History  Problem Relation Age of Onset   Hypertension Father   Older brother has Type 1 Diabetes.    Social History: Lives with: father and siblings.  Starting cosmetology school   Physical Exam:  Vitals:   12/04/22 1010  BP: 118/74  Pulse: 82  Weight: 208 lb 6.4 oz (94.5 kg)      BP 118/74   Pulse 82   Wt 208 lb 6.4 oz (94.5 kg)   BMI 36.65 kg/m  Body mass index: body mass index is 36.65 kg/m. Growth %ile SmartLinks can only be used for patients less than 34 years old.  Ht Readings from Last 3 Encounters:  11/16/19 5' 3.23" (1.606 m) (34 %, Z= -0.42)*  08/18/19 5' 2.99" (1.6 m) (31 %,  Z= -0.51)*  06/29/19 5\' 3"  (1.6 m) (31 %, Z= -0.50)*   * Growth percentiles are based on CDC (Girls, 2-20 Years) data.   Wt Readings from Last 3 Encounters:  12/04/22 208 lb 6.4 oz (94.5 kg)  08/27/22 194 lb 9.6 oz (88.3 kg)  11/20/21 201 lb (91.2 kg)    Physical exam General: Well developed, well nourished female in no acute distress.  Head: Normocephalic, atraumatic.   Eyes:  Pupils equal and round. EOMI.   Sclera white.  No eye drainage.   Ears/Nose/Mouth/Throat: Nares patent, no nasal drainage.  Normal dentition, mucous membranes moist.   Neck: supple, no cervical lymphadenopathy, no thyromegaly Cardiovascular: regular rate, normal S1/S2, no murmurs Respiratory: No increased work of breathing.  Lungs clear to auscultation bilaterally.  No wheezes. Abdomen: soft, nontender, nondistended. No appreciable masses  Extremities: warm, well perfused, cap refill < 2 sec.   Musculoskeletal: Normal muscle mass.  Normal strength Skin: warm, dry.  No rash or lesions. Neurologic: alert and oriented, normal speech, no tremor    Labs: Last hemoglobin A1c:   Results for orders placed or performed in visit on 12/04/22  POCT glycosylated  hemoglobin (Hb A1C)  Result Value Ref Range   Hemoglobin A1C 6.6 (A) 4.0 - 5.6 %   HbA1c POC (<> result, manual entry)     HbA1c, POC (prediabetic range)     HbA1c, POC (controlled diabetic range)    POCT Glucose (Device for Home Use)  Result Value Ref Range   Glucose Fasting, POC     POC Glucose 177 (A) 70 - 99 mg/dl      Assessment/Plan: Ramatoulaye is a 22 y.o. female with type 1 diabetes on Tandem TSlim insulin pump. She has done well with diabetes care since last visit. Hemoglobin A1c has improved to 6.6% which meets ADA goal of <7%. Her time in target range is 64%. She is clinically euthyroid on 100 mcg of levothyroxine per day.    1-2. DM w/o complication type I, uncontrolled (HCC)/Hyperglycemia - Reviewed insulin pump and CGM download. Discussed trends and patterns.  - Rotate pump sites to prevent scar tissue.  - bolus 15 minutes prior to eating to limit blood sugar spikes.  - Reviewed carb counting and importance of accurate carb counting.  - Discussed signs and symptoms of hypoglycemia. Always have glucose available.  - POCT glucose and hemoglobin A1c  - Reviewed growth chart.  - Discussed transition to adult endocrinology.   3. Hypothyroidism, acquired, autoimmune - TSH, FT4 and t4  - 100 mcg of levothyroxine per day   4. Insulin pump titration  - No changes to pump settings.    5 Microalbuminuria  - Followed by neprhology  - Continue Losartan  -Stressed importance of good glucose control.   6. Irregular menstrual cycle  - Discussed work up for PCOS. She is consider OBGYN evaluation. She will contact me if she changes her mind and would like evaluation. Also discussed option for OCP.   Follow-up:   3 months.    LOS: >40  spent today reviewing the medical chart, counseling the patient/family, and documenting today's visit.     When a patient is on insulin, intensive monitoring of blood glucose levels is necessary to avoid hyperglycemia and hypoglycemia. Severe  hyperglycemia/hypoglycemia can lead to hospital admissions and be life threatening.     Gretchen Short,  FNP-C  Pediatric Specialist  [address]  [place], [address]  Tele: [phone]  ADDENDUM  -  Labs came back with elevated TSH, normal T4 and FT4. After speaking with [name], she realized that she did not pick up the updated prescription increasing her dose from 75 mcg to 100 mcg. She has been taking the 75 mcg tablet. I advised to start 100 mcg and recheck labs in 3 months.

## 2022-12-04 NOTE — Patient Instructions (Signed)
It was a pleasure seeing you in clinic today. Please do not hesitate to contact me if you have questions or concerns.   Please sign up for MyChart. This is a communication tool that allows you to send an email directly to me. This can be used for questions, prescriptions and blood sugar reports. We will also release labs to you with instructions on MyChart. Please do not use MyChart if you need immediate or emergency assistance. Ask our wonderful front office staff if you need assistance.   - Labs today for thyroid. I will call send mychart message with results within 2 weeks.  - Hemoglobin A1c is 6.6%. Excellent work  - Follow up with adult endocrinology. It has been a pleasure getting to know you.

## 2022-12-05 LAB — TSH: TSH: 19.43 mIU/L — ABNORMAL HIGH

## 2022-12-05 LAB — T4: T4, Total: 9.8 ug/dL (ref 5.1–11.9)

## 2022-12-05 LAB — T4, FREE: Free T4: 1.5 ng/dL (ref 0.8–1.8)

## 2022-12-09 ENCOUNTER — Encounter (INDEPENDENT_AMBULATORY_CARE_PROVIDER_SITE_OTHER): Payer: Self-pay

## 2022-12-10 ENCOUNTER — Telehealth (INDEPENDENT_AMBULATORY_CARE_PROVIDER_SITE_OTHER): Payer: Self-pay | Admitting: Family

## 2022-12-10 NOTE — Telephone Encounter (Signed)
Who's calling (name and relationship to patient) : [facility]  Best contact number: [phone] ext. [phone]  Provider they see: Cherly Anderson  Reason for call:  Boyd Kerbs called in stating that they received a referral, but they are needing demographics and insurance card. She has requested for that info to be faxed over.   Fax: [phone]

## 2022-12-11 ENCOUNTER — Other Ambulatory Visit (INDEPENDENT_AMBULATORY_CARE_PROVIDER_SITE_OTHER): Payer: Self-pay | Admitting: Family

## 2022-12-11 MED ORDER — LEVOTHYROXINE SODIUM 100 MCG PO TABS: 100.0000 ug | ORAL_TABLET | Freq: Every day | ORAL | 0 refills | Status: AC

## 2022-12-11 NOTE — Telephone Encounter (Signed)
Paperwork faxed °

## 2023-03-09 ENCOUNTER — Ambulatory Visit (INDEPENDENT_AMBULATORY_CARE_PROVIDER_SITE_OTHER): Payer: 59 | Admitting: Family

## 2023-10-26 ENCOUNTER — Encounter (INDEPENDENT_AMBULATORY_CARE_PROVIDER_SITE_OTHER): Payer: Self-pay

## 2023-11-08 ENCOUNTER — Encounter (INDEPENDENT_AMBULATORY_CARE_PROVIDER_SITE_OTHER): Payer: Self-pay
